# Patient Record
Sex: Female | Born: 1970 | Race: White | Hispanic: No
Health system: Western US, Academic
[De-identification: ages and names within clinical notes are randomized; demographics above are authoritative.]

## PROBLEM LIST (undated history)

## (undated) DIAGNOSIS — T782XXA Anaphylactic shock, unspecified, initial encounter: Secondary | ICD-10-CM

## (undated) DIAGNOSIS — B351 Tinea unguium: Secondary | ICD-10-CM

## (undated) DIAGNOSIS — K589 Irritable bowel syndrome without diarrhea: Secondary | ICD-10-CM

## (undated) DIAGNOSIS — L709 Acne, unspecified: Secondary | ICD-10-CM

## (undated) DIAGNOSIS — IMO0002 Reserved for concepts with insufficient information to code with codable children: Secondary | ICD-10-CM

## (undated) DIAGNOSIS — I7300 Raynaud's syndrome without gangrene: Secondary | ICD-10-CM

## (undated) DIAGNOSIS — J45909 Unspecified asthma, uncomplicated: Secondary | ICD-10-CM

## (undated) DIAGNOSIS — I341 Nonrheumatic mitral (valve) prolapse: Secondary | ICD-10-CM

## (undated) DIAGNOSIS — L409 Psoriasis, unspecified: Secondary | ICD-10-CM

## (undated) DIAGNOSIS — J309 Allergic rhinitis, unspecified: Secondary | ICD-10-CM

## (undated) HISTORY — DX: Nonrheumatic mitral (valve) prolapse: I34.1

## (undated) HISTORY — DX: Allergic rhinitis, unspecified: J30.9

## (undated) HISTORY — DX: Unspecified asthma, uncomplicated: J45.909

## (undated) HISTORY — DX: Tinea unguium: B35.1

## (undated) HISTORY — DX: Psoriasis, unspecified: L40.9

## (undated) HISTORY — DX: Acne, unspecified: L70.9

## (undated) HISTORY — DX: Anaphylactic shock, unspecified, initial encounter: T78.2XXA

## (undated) HISTORY — DX: Irritable bowel syndrome without diarrhea: K58.9

## (undated) HISTORY — DX: Reserved for concepts with insufficient information to code with codable children: IMO0002

## (undated) HISTORY — PX: SURGICAL HX OTHER: 99

## (undated) HISTORY — DX: Raynaud's syndrome without gangrene: I73.00

---

## 1998-01-13 DIAGNOSIS — K2970 Gastritis, unspecified, without bleeding: Secondary | ICD-10-CM

## 1998-01-13 HISTORY — DX: Gastritis, unspecified, without bleeding: K29.70

## 2010-01-13 HISTORY — PX: PR EXCISION VAGINAL CYST/TUMOR: 57135

## 2012-02-11 ENCOUNTER — Ambulatory Visit (HOSPITAL_BASED_OUTPATIENT_CLINIC_OR_DEPARTMENT_OTHER): Payer: PRIVATE HEALTH INSURANCE | Admitting: Registered Nurse

## 2012-03-16 ENCOUNTER — Ambulatory Visit: Payer: PRIVATE HEALTH INSURANCE | Attending: Registered Nurse | Admitting: Registered Nurse

## 2012-03-16 ENCOUNTER — Encounter (HOSPITAL_BASED_OUTPATIENT_CLINIC_OR_DEPARTMENT_OTHER): Payer: Self-pay | Admitting: Registered Nurse

## 2012-03-16 VITALS — BP 125/78 | HR 67 | Ht 68.5 in | Wt 140.4 lb

## 2012-03-16 DIAGNOSIS — T782XXA Anaphylactic shock, unspecified, initial encounter: Secondary | ICD-10-CM | POA: Insufficient documentation

## 2012-03-16 DIAGNOSIS — L408 Other psoriasis: Secondary | ICD-10-CM | POA: Insufficient documentation

## 2012-03-16 DIAGNOSIS — Z0000 Encounter for general adult medical examination without abnormal findings: Secondary | ICD-10-CM | POA: Insufficient documentation

## 2012-03-16 DIAGNOSIS — L708 Other acne: Secondary | ICD-10-CM | POA: Insufficient documentation

## 2012-03-16 MED ORDER — EPINEPHRINE 0.3 MG/0.3ML IJ DEVI
INTRAMUSCULAR | Status: DC
Start: 2012-03-16 — End: 2013-05-02

## 2012-03-16 MED ORDER — TRETINOIN 0.025 % EX CREA
TOPICAL_CREAM | CUTANEOUS | Status: DC
Start: 2012-03-16 — End: 2013-05-06

## 2012-03-16 MED ORDER — FLUOCINOLONE ACETONIDE 0.01 % EX SOLN
CUTANEOUS | Status: DC
Start: 2012-03-16 — End: 2013-07-14

## 2012-03-16 NOTE — Addendum Note (Signed)
Addended by: HANSON, TYLER G on: 03/16/2012 01:52 PM     Modules accepted: Orders

## 2012-03-16 NOTE — Progress Notes (Signed)
[facility]-Clinic Note        Claudia Jensen is a 42 year old woman, here today for a preventive health visit/routine medical exam. Other problems, concerns, or updates today:    1. She is new to me and the clinic. No major concerns and is establishing care. Needs refills on meds and an annual.     Patient Active Problem List   Diagnosis   . Anaphylactic reaction       Past Medical History   Diagnosis Date   . Onychomycosis of toenail    . AR (allergic rhinitis)      uses zyrtec   . Asthma      rare and related to ar   . Gastritis 2000     endoscopy   . IBS (irritable bowel syndrome)      diet related   . Psoriasis      scalp-steroid   . Acne      trentinoin   . Raynaud's disease      lifelong   . MVP (mitral valve prolapse)      had chest pain and none now-echo normal   . Anaphylactic reaction      bees and wasps   . Abnormal pap      in the past and neg in 2012. No treatment       Family History   Problem Relation Age of Onset   . Colon Cancer Mother 37   . Parkinson's disease Father    . Hypertension Mother    . Cancer Father      esophageal cancer       Current Outpatient Prescriptions   Medication Sig   . EPINEPHrine 0.3 MG/0.3ML Injection Device Inject as instructed per patient package insert, 0.3 mg intramuscularly or subcutaneously into the thigh, if needed to treat anaphylaxis   . Fluocinolone Acetonide 0.01 % External Solution apply to the base of the scalp twice daily   . Tretinoin (RETIN-A) 0.025 % External Cream apply at hs     No current facility-administered medications for this visit.       Review of patient's allergies indicates:  No Known Allergies    History     Social History   . Marital Status: N/A     Spouse Name: N/A     Number of Children: N/A   . Years of Education: N/A     Social History Main Topics   . Smoking status: Not on file   . Smokeless tobacco: Not on file   . Alcohol Use: Not on file   . Drug Use: Not on file   . Sexually Active: Yes -- Female partner(s)          Other Topics Concern   . Not on file     Social History Narrative    Claudia Jensen and her family moved here from [place] several years ago. 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 Her girls go to [place] and [employer].     Gynecologic History  LMP:  two weeks ago  Abnormal bleeding, spotting,or discharge: No    Last pap: 2013  Contraception: vasectomy  Sexual activity: heterosexual,spouse  Sexual concerns: No    Lifestyle  Dietary Habits: discussed during visit  Calcium: dietary sources only  Current exercise habits: yes, engages in regular exercise  Substance Use: see social history  Stress: Yes: but managing     Review Of Systems:  As noted above; all  other systems reviewed and are negative. See scanned update form    Physical Exam:  BP 125/78  Pulse 67  Ht 5' 8.5" (1.74 m)  Wt 140 lb 6.4 oz (63.685 kg)  BMI 21.03 kg/m2  Body mass index is 21.03 kg/(m^2).  General appearance: healthy, alert, no distress, relaxed, cooperative  Skin: Skin color, texture, turgor normal. No rashes or concerning lesions  Neck: supple. No adenopathy. Thyroid symmetric, normal size, without nodules  Breasts: No obvious deformity or mass to inspection, no skin lesion or nipple discharge, no masses palpated, no axillary lymphadenopathy  Lungs: clear to auscultation  Heart: normal rate, regular rhythm  Abdomen: soft, non-tender. No masses or organomegaly  Pelvic: normal external genitalia and vagina, cervix normal in appearance, no CMT, no bladder tenderness, uterus normal size, shape, and consistency, no adnexal masses or tenderness   Skin: warm and dry with no rashes or concerning nevi   Neuro: speech normal, mental status intact, thoughts coherent, no focal deficits to observation, gait normal    History sections of chart reviewed and updated today YES    Assessment/Plan:    1. Reviewed and updated:  Health Maintenance   Topic Date Due   . Colonoscopy Q 5 Years  1972/04/17   . Tetanus Booster  01/03/1985   . Influenza > 6 Months   10/13/2012   . Pap Smear 21-64 Years  03/17/2015       2.  Claudia Jensen was seen today for establish care.    Diagnoses and associated orders for this visit:    Routine general medical examination at a health care facility  Overall doing well, and up to date with HCM issues as of today. Excellent self-care and motivation to continue with her healthy lifestyle practices.    - PAP SMEAR (OPTIONAL HPV)    Anaphylactic reaction  - EPINEPHrine 0.3 MG/0.3ML Injection Device; Inject as instructed per patient package insert, 0.3 mg intramuscularly or subcutaneously into the thigh, if needed to treat anaphylaxis    Preventive counseling:health maintenance  skin cancer prevention/self-exam  immunizations  dental care  other drug use  diet rich in 3 omega fatty acids and low in saturated fats  low-fat high-fiber diet  adequate calcium intake  vitamin D supplementation  proper exercise  seat belt use     Patient Education: see patient instructions/AVS    Follow-up: Follow up for annual health screening.    Claudia Jensen, MN  [facility]   [address] [place], [place] [address]

## 2012-03-16 NOTE — Patient Instructions (Signed)
Leading a Healthy Life-Six tips to help improve your health and wellness       Eat well to give your body the energy it needs.     Stay or get active.     A healthy mind is part of a healthy body.     Practice safe living habits.     Keep your mind and body free of harmful drugs and alcohol.     Get regular health care.     Tip #1: Eat well to give your body the energy it needs     Your body needs nutritious foods to stay strong and healthy. Here are some general eating guidelines:     Have 2 servings of fish or other seafood 2 times a week (1 serving = 4 ounces).     If you eat dairy products, choose low-fat (1%) or nonfat ones; consider nut, soy, rice or other non-animal milks     If you eat meat, cut down on the amount. Replace it with plant-based foods such as beans, whole grains, fruits and vegetables, and nuts and seeds.     Have less than 1,500 mg of sodium (salt) a day.     Cut down on "junk food" like alcohol, fatty foods, chips, candy, and other sweets.     Tip #2: Stay or get active     Exercise for at least 30 minutes at a time, at least 3 times a week. Regular physical activity can help you:     Live longer and feel better     Be stronger and more flexible     Build strong bones     Prevent depression and anxiety     Strengthen your immune system     Maintain a healthy body weight     Improve brain function    Tip #3: Remember: A healthy mind is part of a healthy body    A good state of mind can help you make healthy choices. Here are a few tips for keeping your mind healthy:     Reduce stress in your life!  Stress is one of the main causes of illness.    Make some time every day for things that are fun and/or pleasant     Get enough sleep. Lack of sleep reduces how well you can concentrate, increases mood swings, and raises your risk of having a car accident.     Ask your health care provider for help if you feel depressed or anxious for more than several days in a row.     Tip #4: Practice safe living  habits     Accidents and Injuries     Accidents and injuries are the 5th leading cause of death in the [place]     Women under age 35 are more likely to die in motor vehicle accidents than from any other cause.     Accidents in the home cause thousands of permanent injuries every year. The most common accidents are fires, falls, and drowning. To help yourself and your family stay safe:     Install smoke detectors on each floor of your home.     Make sure everyone in your family knows how to swim    Stay safe on the road:  o Wear a seatbelt.   o Do not ride with someone who has been drinking or taking drugs.   o Do not speak on a cell phone or send, read, or write text   messages while you are driving.   o Wear a helmet when you ride a bicycle or motorcycle.   o Get enough sleep at night, and do not drive when you are tired.     Hand Hygiene   Protect yourself from germs by washing your hands often. Always wash your hands:     After you change a diaper or use the toilet     Before you start and after you finish preparing food     Tip #5: Keep your mind and body free of harmful drugs and alcohol      Tobacco causes more health problems than any other substance. These problems include lung disease, heart disease, and many types of cancer. The nicotine in tobacco is the most addictive and widely used drug.    Too much alcohol can cause damage to your liver, heart, brain, bones, and other body tissues. Being under the influence of alcohol also increases your chance of being injured in an accident. Alcohol can cause fetal alcohol syndrome in your children if you drink regularly when you are pregnant.    Street drugs, like marijuana, cocaine, methamphetamine, heroin, or pain pills not prescribed by your doctor can harm your health. They may be mixed with harmful substances, and using them can cause people to put themselves in dangerous situations.     Tip #6: Get regular health care     Many people think they need to see  their provider only when they are sick. But, health care providers can also help you stay healthy.     Find a health care provider who works with you to manage your health.     Ask your health care provider what diseases you are at risk for. Learn what you can do to prevent or control them.    Get yourself and your family immunized against life-threatening diseases.   Don't save your concerns for your annual check-up. Come in as they occur.   Smile more-it makes you feel good!  

## 2012-03-17 LAB — CERVICAL CANCER SCREENING: Cytologic Impression: NEGATIVE

## 2012-03-19 LAB — HPV ONLY

## 2012-05-27 ENCOUNTER — Ambulatory Visit: Payer: PRIVATE HEALTH INSURANCE | Attending: Registered Nurse | Admitting: Registered Nurse

## 2012-05-27 VITALS — BP 126/80 | HR 63 | Temp 98.2°F | Wt 139.8 lb

## 2012-05-27 DIAGNOSIS — I889 Nonspecific lymphadenitis, unspecified: Secondary | ICD-10-CM | POA: Insufficient documentation

## 2012-05-27 MED ORDER — AMOXICILLIN-POT CLAVULANATE 875-125 MG OR TABS
ORAL_TABLET | ORAL | Status: DC
Start: 2012-05-27 — End: 2012-12-06

## 2012-05-27 NOTE — Progress Notes (Signed)
[facility] Clinic Note      Chief Complaint   Patient presents with   . Head Injury     Pt here for pain on the left side of her head. Pt noticed something that felt like a boyle in three different areas of her head. Pt states it seems to be geeting worse. Unable to sleep on left side.       Issues Discussed/HPI:    1. As above she noted a burning and achy pain on the left around 5 days ago and it has not gotten better. She thought there may be a lesion or two, but has not been able to find any on her scalp. Hard to sleep on that side of her head due to the pain with pressure. No internal headache, fever, chills, sick sx. No neuro changes or sx. No new product use or trauma. Neck is not tender and HEENT is otherwise neg    Patient Active Problem List   Diagnosis   . Anaphylactic reaction       Current Outpatient Prescriptions   Medication Sig   . Amoxicillin-Pot Clavulanate 875-125 MG Oral Tab Take 1 tablet by mouth every 12 hours for infection, until all medication is taken   . EPINEPHrine 0.3 MG/0.3ML Injection Device Inject as instructed per patient package insert, 0.3 mg intramuscularly or subcutaneously into the thigh, if needed to treat anaphylaxis   . Fluocinolone Acetonide 0.01 % External Solution apply to the base of the scalp twice daily   . Tretinoin (RETIN-A) 0.025 % External Cream apply at hs     No current facility-administered medications for this visit.       Review of patient's allergies indicates:  No Known Allergies    Physical Exam:  BP 126/80  Pulse 63  Temp(Src) 98.2 F (36.8 C) (Temporal)  Wt 139 lb 12.8 oz (63.413 kg)  BMI 20.94 kg/m2  LMP 05/06/2012  Constitutional: Appearance: Well developed, appearing stated age and in no acute distress  Eyes: Lids and Conjunctiva: Conjunctiva and lids are normal.  Pupils: Puplis equal, round and reactive to light.    ENT: Otoscopic exam: canals are clear; TMs show normal landmarks without injection or fluid behind the membranes,      Neck: Inspection:  normal alignment; no masses  Thyroid:  No enlargement, masses or tenderness.  Cervial nodes: no adenopathy in the neck but she does have an enlarged posterior occip lymph node and this is tender. No lesions noted throughout scalp    Assessment and Plan:  Brynnleigh was seen today for head injury.    Diagnoses and associated orders for this visit:    Lymphadenitis  -       I advised her that I am not really sure what this is but given the enlarged node and tenderness without any other physical findings, I have diagnosed her with the above. She will let me know if her sx worsen and if they get better with the treatment. No evidence of shingles or herpes  - Amoxicillin-Pot Clavulanate 875-125 MG Oral Tab; Take 1 tablet by mouth every 12 hours for infection, until all medication is taken    Patient Ed:   Patient Instructions   Message me in a week with your status, or sooner if your sx do not get better.     Follow-up:  As above    Baker Pierini MN, ARNP  [facility]  [facility]  [address] [place] [address]

## 2012-05-27 NOTE — Patient Instructions (Signed)
Message me in a week with your status, or sooner if your sx do not get better.

## 2012-06-07 ENCOUNTER — Encounter (HOSPITAL_BASED_OUTPATIENT_CLINIC_OR_DEPARTMENT_OTHER): Payer: Self-pay | Admitting: Registered Nurse

## 2012-06-08 NOTE — Telephone Encounter (Signed)
Claudia Jensen will see Claudia Jensen on 06/15/12.

## 2012-06-08 NOTE — Telephone Encounter (Signed)
Message left on voice mail to return my call.

## 2012-06-10 ENCOUNTER — Telehealth (HOSPITAL_BASED_OUTPATIENT_CLINIC_OR_DEPARTMENT_OTHER): Payer: Self-pay | Admitting: Registered Nurse

## 2012-06-10 NOTE — Telephone Encounter (Signed)
CONFIRMED PHONE NUMBER: [phone]  CALLERS FIRST AND LAST NAME: Annalynne Ibanez  FACILITY NAME: NA TITLE: NA  CALLERS RELATIONSHIP:Self  RETURN CALL: Detailed message on voicemail only     SUBJECT: General Message   REASON FOR REQUEST: sooner appointment request    MESSAGE: Patient called asking to be seen sooner than her 06/15/12 appointment. She got an e-care message (please see previous closed message) to call if she wanted to come in sooner. Nothing was available with Lisette Grinder, and patient declined a different provider. Please contact her if it is possible for her to be seen sooner. Thanks!

## 2012-06-15 ENCOUNTER — Ambulatory Visit: Payer: PRIVATE HEALTH INSURANCE | Attending: Registered Nurse | Admitting: Registered Nurse

## 2012-06-15 ENCOUNTER — Encounter (HOSPITAL_BASED_OUTPATIENT_CLINIC_OR_DEPARTMENT_OTHER): Payer: Self-pay | Admitting: Registered Nurse

## 2012-06-15 VITALS — BP 137/79 | HR 62 | Temp 98.5°F | Wt 140.4 lb

## 2012-06-15 DIAGNOSIS — R5383 Other fatigue: Secondary | ICD-10-CM | POA: Insufficient documentation

## 2012-06-15 DIAGNOSIS — R5381 Other malaise: Secondary | ICD-10-CM | POA: Insufficient documentation

## 2012-06-15 DIAGNOSIS — J0110 Acute frontal sinusitis, unspecified: Secondary | ICD-10-CM | POA: Insufficient documentation

## 2012-06-15 LAB — CBC (HEMOGRAM)
Hematocrit: 35 % — ABNORMAL LOW (ref 36–45)
Hemoglobin: 11.4 g/dL — ABNORMAL LOW (ref 11.5–15.5)
MCH: 29.8 pg (ref 27.3–33.6)
MCHC: 32.6 g/dL (ref 32.2–36.5)
MCV: 91 fL (ref 81–98)
Platelet Count: 188 10*3/uL (ref 150–400)
RBC: 3.83 mil/uL (ref 3.80–5.00)
RDW-CV: 12.5 % (ref 11.6–14.4)
WBC: 4.83 10*3/uL (ref 4.3–10.0)

## 2012-06-15 LAB — THYROID STIMULATING HORMONE: Thyroid Stimulating Hormone: 1.455 u[IU]/mL (ref 0.400–5.000)

## 2012-06-15 MED ORDER — AMOXICILLIN-POT CLAVULANATE 875-125 MG OR TABS
ORAL_TABLET | ORAL | Status: DC
Start: 2012-06-15 — End: 2012-12-06

## 2012-06-15 NOTE — Patient Instructions (Signed)
Plan to email in three weeks for a follow up and I will get back to you when I see labs

## 2012-06-16 NOTE — Progress Notes (Signed)
[facility] Clinic Note      Chief Complaint   Patient presents with   . Follow-Up      Follow up check after finishing antibiotics        Issues Discussed/HPI:    1. She noticed a 75 percent improvement in her sx when she took the antibiotics. She has no further pain or sensitivity on the outside of her head but does report sinus congestion and throbbing sensations on the left side of her face and head. She also has notable fatigue and does not know why. She has chronic allergies and does not treat them. Often will note her sinuses being full and has had sinus infections in the past. No other neuro sx. No ear pain or aural fullness. Scalp without irritation or lesions.     ROS: CONSTITUTIONAL: Denies, unexpected weight loss, unexpected weight gain  ENT: Denies, hearing difficulties, ear pain, tinnitus and epistaxis  NEUROLOGIC: Denies, dizziness, syncope, focal weakness, sensory deficits and paresthesias    Patient Active Problem List   Diagnosis   . Anaphylactic reaction       Current Outpatient Prescriptions   Medication Sig   . Amoxicillin-Pot Clavulanate 875-125 MG Oral Tab Take 1 tablet by mouth every 12 hours for infection, until all medication is taken   . Amoxicillin-Pot Clavulanate 875-125 MG Oral Tab Take 1 tablet by mouth every 12 hours for infection, until all medication is taken   . EPINEPHrine 0.3 MG/0.3ML Injection Device Inject as instructed per patient package insert, 0.3 mg intramuscularly or subcutaneously into the thigh, if needed to treat anaphylaxis   . Fluocinolone Acetonide 0.01 % External Solution apply to the base of the scalp twice daily   . Tretinoin (RETIN-A) 0.025 % External Cream apply at hs     No current facility-administered medications for this visit.       Review of patient's allergies indicates:  No Known Allergies    Physical Exam:  BP 137/79  Pulse 62  Temp(Src) 98.5 F (36.9 C)  Wt 140 lb 6.4 oz (63.685 kg)  BMI 21.03 kg/m2  LMP  06/05/2012  Constitutional: Appearance: Well developed, appearing stated age and in no acute distress  ENT: Otoscopic exam: canals are clear; TMs show normal landmarks without injection or fluid behind the membranes,   Nose: normal nasal mucosa, septum and turbinates. Tender in the max sinuses with palaption  Pharynx: oropharynx  without post-nasal drainage or erythema  Neck: Cervical nodes: no adenopathy  Axillary nodes:  No adenopathy.  Neuro: Cranial Nerves:  CNs II - XII normal.  DTRs:  Normal DTRs.  No pathologic reflexes.  Sensation:Normal to light touch and pin in hands and feet    Lab review: orders written for new lab studies as appropriate; see orders.     Assessment and Plan:  Shimika was seen today for follow-up .    Diagnoses and associated orders for this visit:    Sinusitis, acute frontal  -      She has fatigue and left sided sx consistent with sinus congestion and got better with a course of antibiotics for her lymphadenitis two weeks ago. Did not have sinus sx at that time, but may have had a low grade infection then as well. Will extend the duration of meds and have her let me know if she is not getting better or if she is getting worse.   - Amoxicillin-Pot Clavulanate 875-125 MG Oral Tab; Take 1 tablet by mouth every  12 hours for infection, until all medication is taken  - VITAMIN D (25 HYDROXY)    Fatigue  - CBC (HEMOGRAM)  - THYROID STIMULATING HORMONE  - VITAMIN D (25 HYDROXY)    Patient Ed:   Patient Instructions   Plan to email in three weeks for a follow up and I will get back to you when I see labs    Follow-up:  return to clinic for recheck    Baker Pierini MN, ARNP  [facility]  [address] [place], [place] [address]

## 2012-06-17 LAB — VITAMIN D (25 HYDROXY)
Vit D (25_Hydroxy) Total: 14.8 ng/mL — ABNORMAL LOW (ref 20.1–50.0)
Vitamin D2 (25_Hydroxy): <1 ng/mL
Vitamin D3 (25_Hydroxy): 14.8 ng/mL

## 2012-06-24 IMAGING — CT CT ABD-PELV W/ CM
1 of 2 series · 15 of 32 positions shown, 19 images · IV contrast (APPLIED)
Comparison: None.

CLINICAL DATA: Right flank pain and mid abdominal pain, acute onset
approximately 8 hours ago.

CT ABDOMEN AND PELVIS WITH CONTRAST 11/25/2009:
TECHNIQUE: Multidetector CT imaging of the abdomen and pelvis was
performed following the standard protocol during bolus
administration of intravenous contrast.
Contrast: 100 ml [id number] IV.  Oral contrast also
administered.

[Series 2: abd/pelv with 5.0 b31f st · axial · 0.68mm/px · z∈[-410,-16]mm · 15 of 87 slices shown, 19 images]
[im 4/87  soft-tissue]
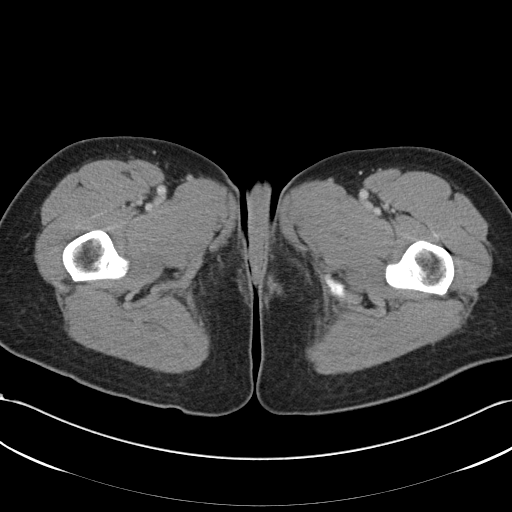
[im 4/87  bone]
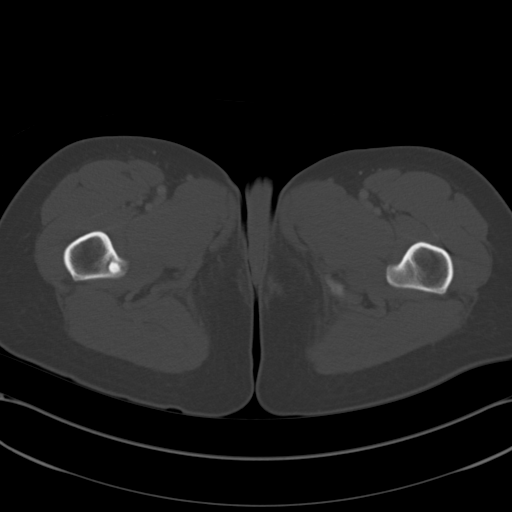
[im 10/87  soft-tissue]
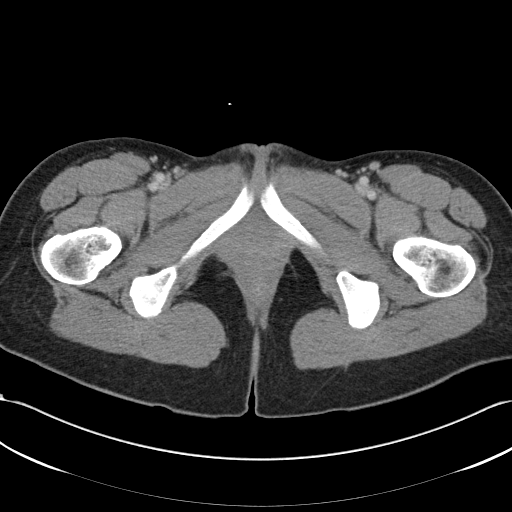
[im 17/87  soft-tissue]
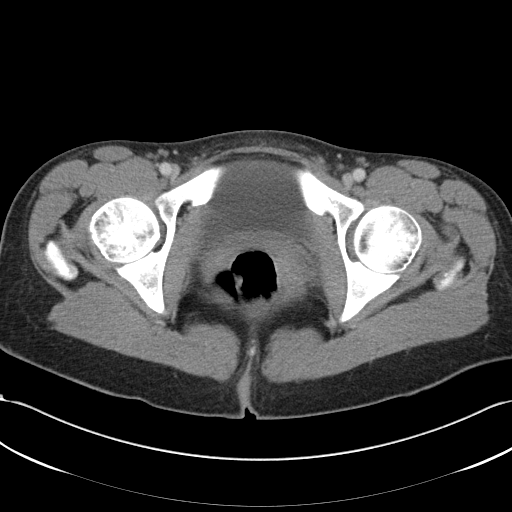
[im 24/87  soft-tissue]
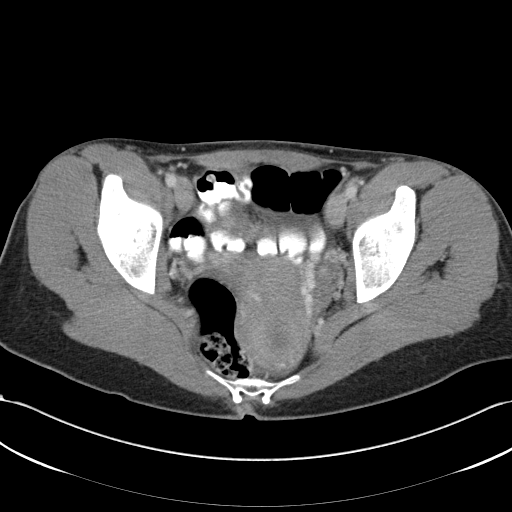
[im 30/87  soft-tissue]
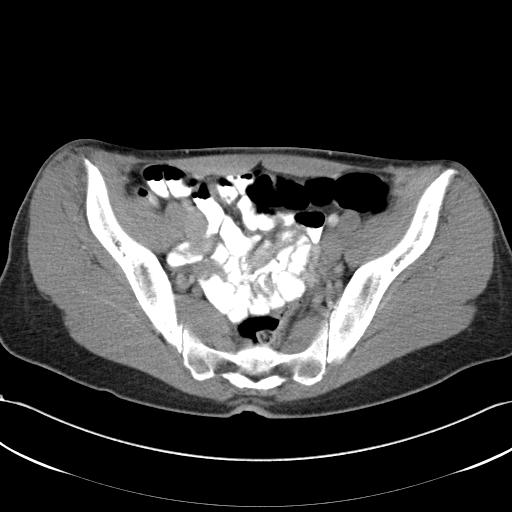
[im 37/87  soft-tissue]
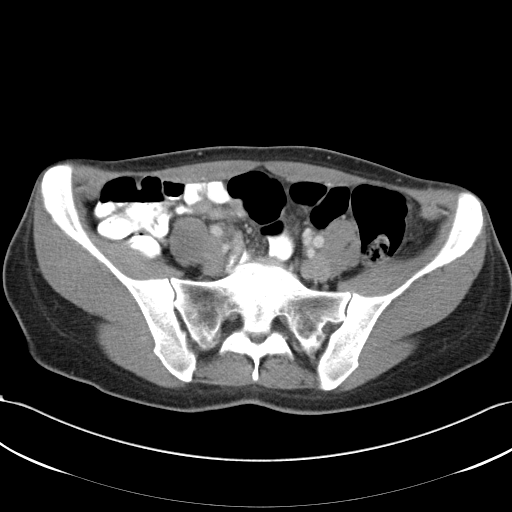
[im 44/87  soft-tissue]
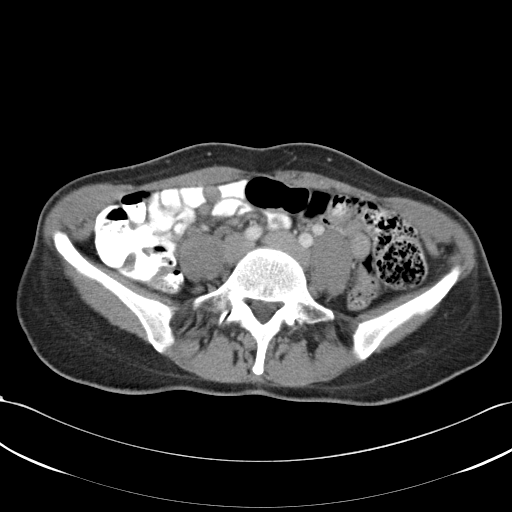
[im 50/87  soft-tissue]
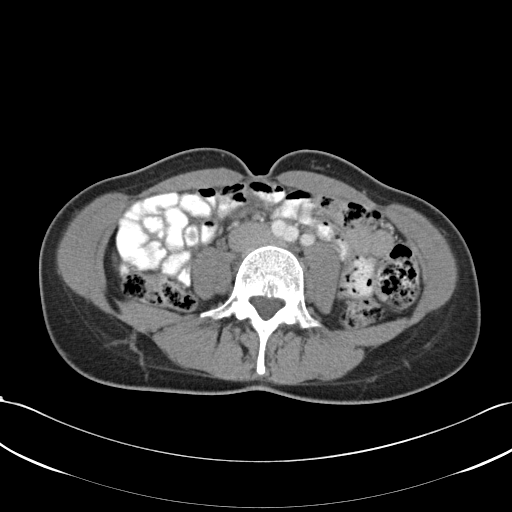
[im 57/87  soft-tissue]
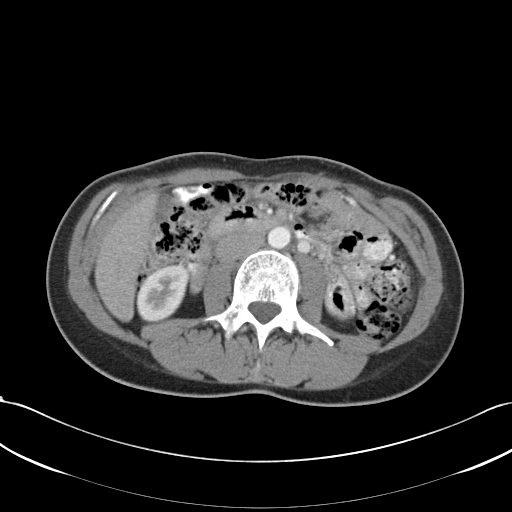
[im 57/87  bone]
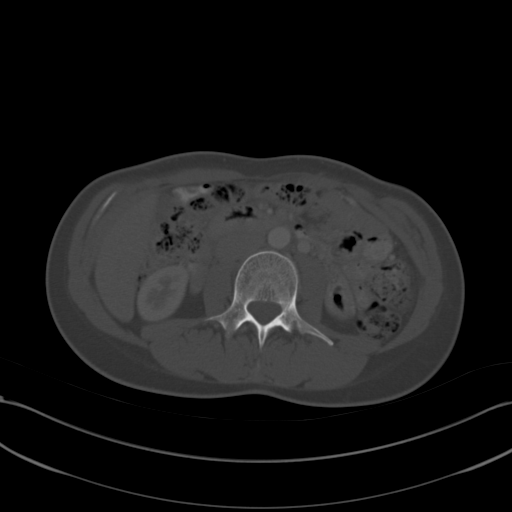
[im 63/87  soft-tissue]
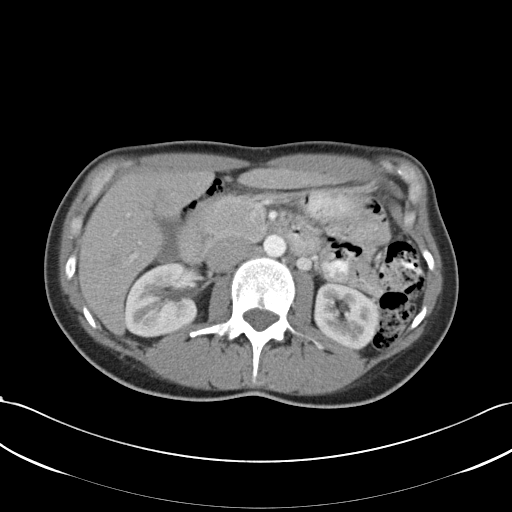
[im 70/87  soft-tissue]
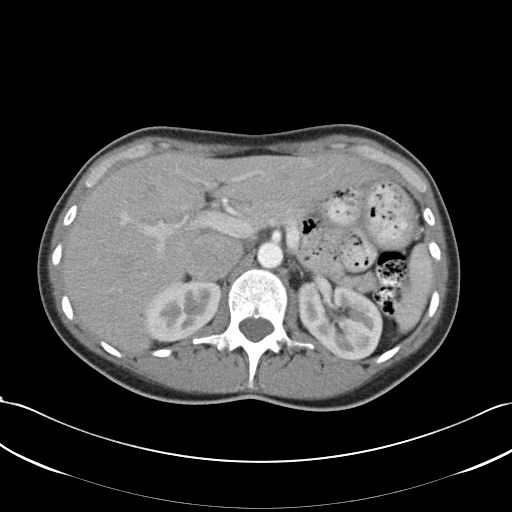
[im 73/87  lung]
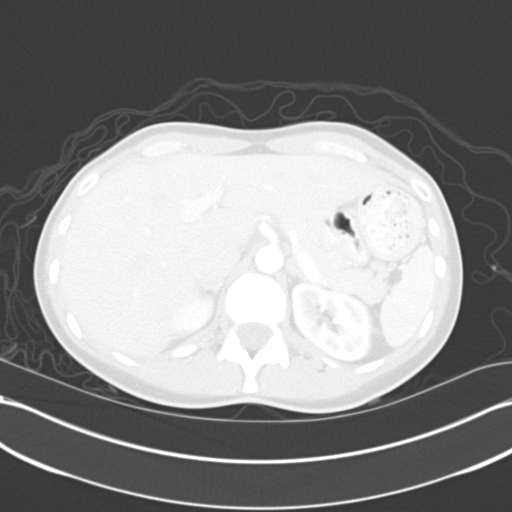
[im 77/87  soft-tissue]
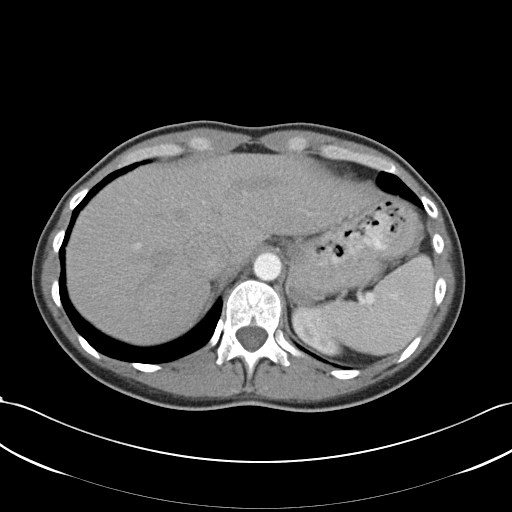
[im 77/87  lung]
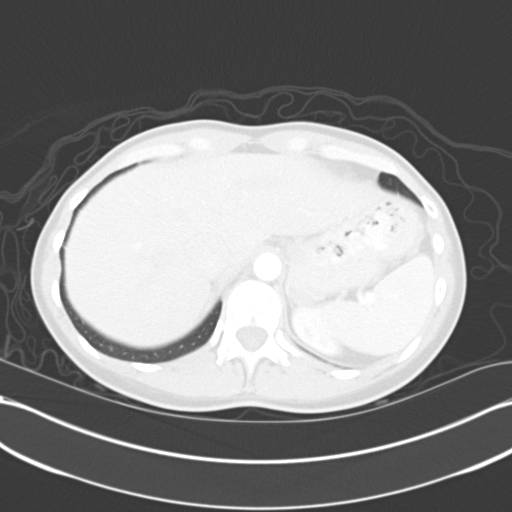
[im 80/87  lung]
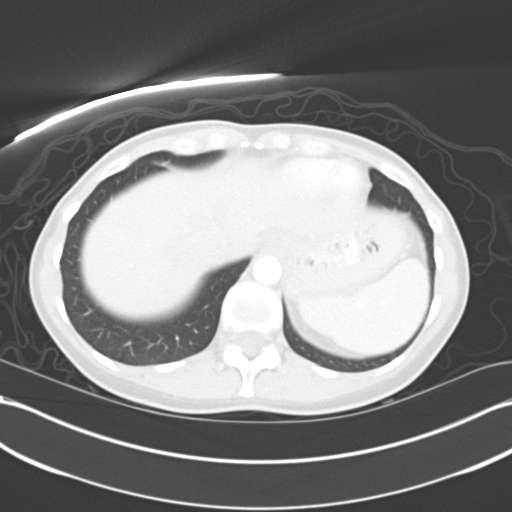
[im 83/87  soft-tissue]
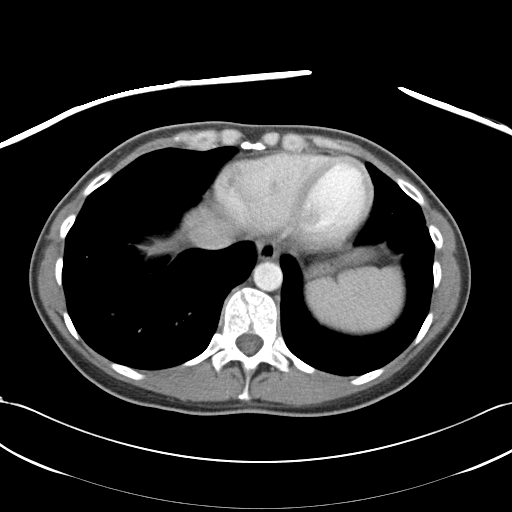
[im 83/87  lung]
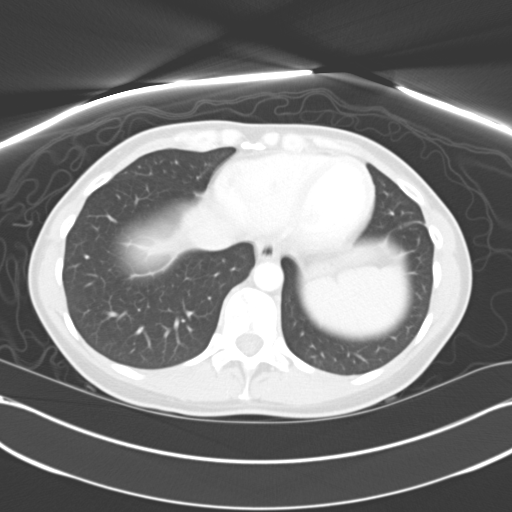

[15 of 32 positions shown; findings below may reference images not displayed]

FINDINGS: Approximate 1.5 cm lesion in the medial segment left lobe
of liver with Hounsfield measurements approximately 40; liver
otherwise normal in appearance.  Normal appearing spleen, pancreas,
adrenal glands, and kidneys.  Gallbladder unremarkable by CT.  No
biliary ductal dilation.  Stomach filled with food and normal in
appearance.  Normal-appearing small bowel.  Large amount of stool
throughout an otherwise normal-appearing colon.  Normal appendix in
the right upper pelvis.  No ascites.  No significant
lymphadenopathy.  No visible aorto-iliofemoral atherosclerosis.

Left ovarian vein varicocele.  Normal-appearing retroverted uterus.
No adnexal masses.  No free pelvic fluid.  Urinary bladder
unremarkable.  Bone window images unremarkable.  Visualized lung
bases clear.
IMPRESSION: 1.  No acute abnormalities in the abdomen or pelvis apart from
possible constipation.
2.  Approximate 1.5 cm lesion in the left lobe of the liver,
statistically a hemangioma, but technically indeterminate by CT.
3.  Left ovarian vein varicocele.
4.  Retroverted uterus which is normal in appearance.

## 2012-11-18 ENCOUNTER — Other Ambulatory Visit: Payer: Self-pay

## 2012-12-06 ENCOUNTER — Ambulatory Visit: Payer: PRIVATE HEALTH INSURANCE | Attending: Registered Nurse | Admitting: Registered Nurse

## 2012-12-06 VITALS — BP 136/84 | HR 55 | Wt 142.6 lb

## 2012-12-06 DIAGNOSIS — R030 Elevated blood-pressure reading, without diagnosis of hypertension: Secondary | ICD-10-CM | POA: Insufficient documentation

## 2012-12-06 DIAGNOSIS — L408 Other psoriasis: Secondary | ICD-10-CM | POA: Insufficient documentation

## 2012-12-06 DIAGNOSIS — R42 Dizziness and giddiness: Secondary | ICD-10-CM | POA: Insufficient documentation

## 2012-12-06 LAB — BASIC METABOLIC PANEL
Anion Gap: 5 (ref 3–11)
Calcium: 9.1 mg/dL (ref 8.9–10.2)
Carbon Dioxide, Total: 26 mEq/L (ref 22–32)
Chloride: 107 mEq/L (ref 98–108)
Creatinine: 0.65 mg/dL (ref 0.38–1.02)
GFR, Calc, African American: >60 mL/min (ref >59)
GFR, Calc, European American: >60 mL/min (ref >59)
Glucose: 86 mg/dL (ref 62–125)
Potassium: 4 mEq/L (ref 3.7–5.2)
Sodium: 138 mEq/L (ref 136–145)
Urea Nitrogen: 9 mg/dL (ref 8–21)

## 2012-12-06 LAB — CBC (HEMOGRAM)
Hematocrit: 39 % (ref 36–45)
Hemoglobin: 13 g/dL (ref 11.5–15.5)
MCH: 31 pg (ref 27.3–33.6)
MCHC: 33.6 g/dL (ref 32.2–36.5)
MCV: 92 fL (ref 81–98)
Platelet Count: 190 10*3/uL (ref 150–400)
RBC: 4.19 mil/uL (ref 3.80–5.00)
RDW-CV: 11.7 % (ref 11.6–14.4)
WBC: 5.49 10*3/uL (ref 4.3–10.0)

## 2012-12-06 LAB — THYROID STIMULATING HORMONE: Thyroid Stimulating Hormone: 1.267 u[IU]/mL (ref 0.400–5.000)

## 2012-12-06 NOTE — Progress Notes (Signed)
[facility] Clinic Note      Chief Complaint   Patient presents with   . 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 She has a family history of hypertension and also became hypertensive on the pill, so discontinued. She recently bought a cuff to check her home bps. She has noted a bit of dizziness when walking, but not running. She has allergies which she does not treat as sthey are not bad enough to use meds on a daily basis. No tinnitus, ear pain, vertigo, nausea, palpitations, exercise intolerance, LE swelling, chest pain. Exercises most days of the week.     2.Requests to see a Derm as she psoriasis is not getting better with the fluocinolide solution.     Patient Active Problem List   Diagnosis   . Anaphylactic reaction   . Elevated BP       Current Outpatient Prescriptions   Medication Sig   . EPINEPHrine 0.3 MG/0.3ML Injection Device Inject as instructed per patient package insert, 0.3 mg intramuscularly or subcutaneously into the thigh, if needed to treat anaphylaxis   . Fluocinolone Acetonide 0.01 % External Solution apply to the base of the scalp twice daily   . Tretinoin (RETIN-A) 0.025 % External Cream apply at hs     No current facility-administered medications for this visit.       Review of patient's allergies indicates:  No Known Allergies    Physical Exam:  BP 136/84  Pulse 55  Wt 142 lb 9.6 oz (64.683 kg)  BMI 21.36 kg/m2  Constitutional: Appearance: Well developed, appearing stated age and in no acute distress  ENT: Otoscopic exam: canals are clear; TMs show dull cone of light and landmarks without injection or fluid behind the membranes,   Nose: normal nasal mucosa, septum and turbinates  Pharynx: oropharynx shows normal tonsils and adenoids without post-nasal drainage  Cardiac:  Auscultation:  Regular rate and rhythym.  S1 and S2 are normal.  No murmurs, gallops or  rubs.    Lab review: orders written for new lab studies as appropriate; see orders.     Assessment and Plan:  Claudia Jensen was seen today for blood pressure.    Diagnoses and associated orders for this visit:    Dizziness  - CBC (HEMOGRAM)  - THYROID STIMULATING HORMONE  - BASIC METABOLIC PANEL    Psoriasis  - REFERRAL TO DERMATOLOGY    Elevated BP  -         Will monitor and send me results via ecare.       Patient Ed:   Medicated shampoo including ketoconazole and selsun nightly for two week, then twice a week for maintenance.     Send me the blood pressures you have collected in one month    Start on an antihistamine and use daily for the next two weeks and let me know if your symptoms do not get better.     Follow-up:  As above    Baker Pierini MN, ARNP  [facility]  [address] [place], [place] [address]

## 2012-12-06 NOTE — Patient Instructions (Addendum)
Medicated shampoo including ketoconazole and selsun nightly for two week, then twice a week for maintenance.     Send me the blood pressures you have collected in one month    Start on an antihistamine and use daily for the next two weeks and let me know if your symptoms do not get better.

## 2013-05-02 ENCOUNTER — Encounter (HOSPITAL_BASED_OUTPATIENT_CLINIC_OR_DEPARTMENT_OTHER): Payer: Self-pay | Admitting: Registered Nurse

## 2013-05-02 ENCOUNTER — Ambulatory Visit: Payer: No Typology Code available for payment source | Attending: Registered Nurse | Admitting: Registered Nurse

## 2013-05-02 VITALS — BP 125/89 | HR 63 | Temp 97.8°F | Resp 12 | Ht 68.5 in | Wt 142.2 lb

## 2013-05-02 DIAGNOSIS — M940 Chondrocostal junction syndrome [Tietze]: Secondary | ICD-10-CM | POA: Insufficient documentation

## 2013-05-02 DIAGNOSIS — T782XXS Anaphylactic shock, unspecified, sequela: Secondary | ICD-10-CM

## 2013-05-02 DIAGNOSIS — T788XXS Other adverse effects, not elsewhere classified, sequela: Secondary | ICD-10-CM | POA: Insufficient documentation

## 2013-05-02 DIAGNOSIS — T7589XS Other specified effects of external causes, sequela: Secondary | ICD-10-CM | POA: Insufficient documentation

## 2013-05-02 MED ORDER — EPINEPHRINE 0.3 MG/0.3ML IJ SOAJ
0.3000 mg | Freq: Once | INTRAMUSCULAR | Status: DC | PRN
Start: 2013-05-02 — End: 2013-05-02

## 2013-05-02 NOTE — Patient Instructions (Addendum)
Aleve one-two pilsl, twice daily for the next five days with food    Avoid lifting for the next month to help heal the tissue between the ribs

## 2013-05-04 NOTE — Progress Notes (Signed)
[facility] Note      Chief Complaint   Patient presents with   . Chest Problem     Pt c/o chest/rib tenderness for couple of months, tender to touch not movement.       Issues Discussed/HPI:    1. She has had focal pain on the left chest wall for the last couple of months. Pain is sharp, worse with palpation, not associated with exercise or chest movement, respiration, fever chills, cough. Better with rest and holding still. Lifts weights and is very active, but no known triggering event. No radiation of pain, respiratory, cardiac or GI sx.     ROS: see hpi    Patient Active Problem List   Diagnosis   . Anaphylactic reaction   . Elevated BP       Current Outpatient Prescriptions   Medication Sig Dispense Refill   . Cholecalciferol (VITAMIN D) 2000 UNITS Oral Cap Take 2,000 Units by mouth daily.       . Fluocinolone Acetonide 0.01 % External Solution apply to the base of the scalp twice daily  60 mL  3   . Tretinoin (RETIN-A) 0.025 % External Cream apply at hs  45 g  11     No current facility-administered medications for this visit.       Review of patient's allergies indicates:  No Known Allergies    Physical Exam:  BP 125/89   Pulse 63   Temp(Src) 97.8 F (36.6 C) (Temporal)   Resp 12   Ht 5' 8.5" (1.74 m)   Wt 142 lb 3.2 oz (64.501 kg)   BMI 21.30 kg/m2     LMP 04/09/2013     Constitutional: Appearance: Well developed, appearing stated age and in no acute distress  Breasts: Palpation:  Normal breast tissue bilaterally, no tenderness, no suspicious masses.    Chest: no color changes, swelling or obvious deformity. Pain with palpation of the costochondral junction on the left at the level of the 5th interspace.   Lungs:  Auscultation:  Clear lung fields without crackles, wheezing or expiratory prolongation  Cardiac:  Auscultation:  Regular rate and rhythym.  S1 and S2 are normal.  No murmurs, gallops or rubs.  Abdomen:  Abdominal exam:  normal in appearance with normal bowel sounds  and no masses or tenderness.    Assessment and Plan:  Leni was seen today for chest problem.    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 Inject into the thigh as instructed per patient package insert.    Patient Ed:   Patient Instructions   Aleve one-two pilsl, twice daily for the next five days with food    Avoid lifting for the next month to help heal the tissue between the ribs    Follow-up:  notify clinic of changes in condition    Thea Silversmith [place], [facility]  [address] [place] [address]

## 2013-05-06 ENCOUNTER — Other Ambulatory Visit: Payer: Self-pay | Admitting: Pharmacist

## 2013-05-06 ENCOUNTER — Encounter (HOSPITAL_BASED_OUTPATIENT_CLINIC_OR_DEPARTMENT_OTHER): Payer: Self-pay | Admitting: Registered Nurse

## 2013-05-06 DIAGNOSIS — L709 Acne, unspecified: Secondary | ICD-10-CM

## 2013-05-06 MED ORDER — TRETINOIN 0.025 % EX CREA
TOPICAL_CREAM | CUTANEOUS | Status: DC
Start: 2013-05-06 — End: 2014-07-19

## 2013-05-07 ENCOUNTER — Ambulatory Visit: Payer: Self-pay

## 2013-05-07 NOTE — Telephone Encounter (Signed)
Caller tells me she dropped a 50 lb carton of tile on hand. Some swelling. Has a new lump near base of fingers. Not sure if broken. 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 Advised to go in but also told if going to wait, should elevate and ice pack. Pain not any issue says pt.  Pt will go if it gets worse or doesn't improve.   Protocol: TRAUMA - HAND AND WRIST-ADULT-AH  Affirmative: Looks like a broken bone (e.g., knuckle is gone or depressed)  Disposition of Go to ED Now suggested.

## 2013-05-07 NOTE — Telephone Encounter (Signed)
-----   Message from [place] sent at 05/07/2013  2:19 PM PDT -----  Regarding: Dropped of 50 lbs of ceramic tile in her hand  >> Claudia Jensen 05/07/2013 02:19 PM  Dropped of 50 lbs of ceramic tile in her hand

## 2013-05-09 ENCOUNTER — Telehealth (HOSPITAL_BASED_OUTPATIENT_CLINIC_OR_DEPARTMENT_OTHER): Payer: Self-pay | Admitting: Registered Nurse

## 2013-05-09 NOTE — Telephone Encounter (Signed)
Prior authorization for Tretinoin was fax to [employer] [phone].  Awaiting for answer.

## 2013-05-09 NOTE — Telephone Encounter (Signed)
Duplicate encounter

## 2013-05-09 NOTE — Telephone Encounter (Signed)
Prior authorization was requested for Tretinoin 0.025% cream  This is not covered with patient's plan  PA form will be filled out, form was given to Stefano Gaul  Will fax back to [facility] [phone].

## 2013-05-09 NOTE — Telephone Encounter (Signed)
Pt contacted CC triage RN on 05/07/13  Presenting Problem: Patient injured hand    Contacted pt to follow up on above call.   Msg left to call back if she is still having problems with her hand.

## 2013-05-09 NOTE — Telephone Encounter (Signed)
Prior authorization for Tretinoin 0.025 % cream was fax to [facility] [phone].  Awaiting for reply from [facility].

## 2013-05-09 NOTE — Telephone Encounter (Signed)
Prior authorization will be initiated after it is signed by Stefano Gaul.  I will fax to [facility] [phone].

## 2013-05-12 ENCOUNTER — Encounter (HOSPITAL_BASED_OUTPATIENT_CLINIC_OR_DEPARTMENT_OTHER): Payer: PRIVATE HEALTH INSURANCE | Admitting: Dermatology

## 2013-05-12 ENCOUNTER — Telehealth (HOSPITAL_BASED_OUTPATIENT_CLINIC_OR_DEPARTMENT_OTHER): Payer: Self-pay | Admitting: Registered Nurse

## 2013-05-12 NOTE — Telephone Encounter (Signed)
[facility] determined the original decision to be overturned. TRETINOIN cream 0.025% was approved.  Patient was notified.

## 2013-05-26 ENCOUNTER — Encounter (HOSPITAL_BASED_OUTPATIENT_CLINIC_OR_DEPARTMENT_OTHER): Payer: PRIVATE HEALTH INSURANCE | Admitting: Dermatology

## 2013-07-06 ENCOUNTER — Ambulatory Visit (HOSPITAL_BASED_OUTPATIENT_CLINIC_OR_DEPARTMENT_OTHER): Payer: No Typology Code available for payment source | Admitting: Registered Nurse

## 2013-07-14 ENCOUNTER — Ambulatory Visit (HOSPITAL_BASED_OUTPATIENT_CLINIC_OR_DEPARTMENT_OTHER): Payer: No Typology Code available for payment source

## 2013-07-14 ENCOUNTER — Telehealth (HOSPITAL_BASED_OUTPATIENT_CLINIC_OR_DEPARTMENT_OTHER): Payer: Self-pay

## 2013-07-14 ENCOUNTER — Encounter (HOSPITAL_BASED_OUTPATIENT_CLINIC_OR_DEPARTMENT_OTHER): Payer: Self-pay | Admitting: Registered Nurse

## 2013-07-14 ENCOUNTER — Other Ambulatory Visit (HOSPITAL_BASED_OUTPATIENT_CLINIC_OR_DEPARTMENT_OTHER): Payer: Self-pay | Admitting: Registered Nurse

## 2013-07-14 ENCOUNTER — Ambulatory Visit: Payer: No Typology Code available for payment source | Attending: Registered Nurse | Admitting: Registered Nurse

## 2013-07-14 VITALS — BP 117/75 | HR 55 | Wt 142.9 lb

## 2013-07-14 DIAGNOSIS — Z130 Encounter for screening for diseases of the blood and blood-forming organs and certain disorders involving the immune mechanism: Secondary | ICD-10-CM | POA: Insufficient documentation

## 2013-07-14 DIAGNOSIS — Z13228 Encounter for screening for other metabolic disorders: Secondary | ICD-10-CM | POA: Insufficient documentation

## 2013-07-14 DIAGNOSIS — Z0000 Encounter for general adult medical examination without abnormal findings: Secondary | ICD-10-CM | POA: Insufficient documentation

## 2013-07-14 DIAGNOSIS — Z23 Encounter for immunization: Secondary | ICD-10-CM | POA: Insufficient documentation

## 2013-07-14 DIAGNOSIS — Z1231 Encounter for screening mammogram for malignant neoplasm of breast: Secondary | ICD-10-CM

## 2013-07-14 DIAGNOSIS — I341 Nonrheumatic mitral (valve) prolapse: Secondary | ICD-10-CM | POA: Insufficient documentation

## 2013-07-14 DIAGNOSIS — Z800 Family history of malignant neoplasm of digestive organs: Secondary | ICD-10-CM | POA: Insufficient documentation

## 2013-07-14 DIAGNOSIS — Z1329 Encounter for screening for other suspected endocrine disorder: Secondary | ICD-10-CM | POA: Insufficient documentation

## 2013-07-14 DIAGNOSIS — Z808 Family history of malignant neoplasm of other organs or systems: Secondary | ICD-10-CM | POA: Insufficient documentation

## 2013-07-14 HISTORY — DX: Family history of malignant neoplasm of other organs or systems: Z80.8

## 2013-07-14 HISTORY — DX: Family history of malignant neoplasm of digestive organs: Z80.0

## 2013-07-14 LAB — LIPID PANEL
Cholesterol (LDL): 90 mg/dL (ref <130)
Cholesterol/HDL Ratio: 2.7
HDL Cholesterol: 63 mg/dL (ref >40)
Non-HDL Cholesterol: 105 mg/dL (ref 0–159)
Total Cholesterol: 168 mg/dL (ref <200)
Triglyceride: 73 mg/dL (ref <150)

## 2013-07-14 LAB — BASIC METABOLIC PANEL
Anion Gap: 5 (ref 4–12)
Calcium: 8.8 mg/dL — ABNORMAL LOW (ref 8.9–10.2)
Carbon Dioxide, Total: 28 mEq/L (ref 22–32)
Chloride: 105 mEq/L (ref 98–108)
Creatinine: 0.7 mg/dL (ref 0.38–1.02)
GFR, Calc, African American: >60 mL/min (ref >59)
GFR, Calc, European American: >60 mL/min (ref >59)
Glucose: 89 mg/dL (ref 62–125)
Potassium: 3.9 mEq/L (ref 3.6–5.2)
Sodium: 138 mEq/L (ref 135–145)
Urea Nitrogen: 10 mg/dL (ref 8–21)

## 2013-07-14 NOTE — Telephone Encounter (Signed)
Sharepoint Assessment Questions    5'8   142lbs   BMI 21.6    Does the patient have sufficient understanding of English? Yes  If NO, which language?    Is the patient able to provide consent? Yes (If no, fill in below)    Legal Guardian                                    DPOA:  NAME:                                            NAME:    Phone:                                           Phone:      PROCEDURE        Procedure MD: Dr. Ova Freshwater   Procedure Type: Colonoscopy  Procedure Date:  08/12/2013     Time:12:45PM    Procedure Check-In Time: 12:15PM    Patient Assessment    Have you ever had any problems in the past with sedation? No  If yes What kind?    (If yes, send to [facility] for evaluation)    Does the patient use Oxygen at Home?  No  (If yes, send to [facility] for evaluation)    If this is an ERCP appointment ask, does the patient have a contrast allergy?  No  If yes, what type of reaction?    (If yes, forward the TE to "p [facility]" for an RN to review)    Is the patient on Anti-coagulant? No  If yes, what Anti-coag medication:   Prescribing provider:     Does the patient have any bleeding or anti-coagulation disorders? No  If yes What kind?      Is the patient Diabetic?  No  Was patient advised to contact their Prescribing MD for direction?      Is the patient on Dialysis? No  If yes: is it Peritoneal Dialysis?     Does the patient use narcotics on a daily basis? No    Does the patient have sleep apnea? No  If yes, does the patient use a sleep apnea machine?   If yes, was patient instructed to bring their CPAP machine with them?     Does the patient have a defibrillator? No  If yes, what is the patient's defib device?   Was the device form faxed to the patients provider?     Is it difficult to start an IV? No    Does the patient have mobility issues that make it difficult to get on a stretcher? No    Patient Teaching  Who received the teaching - Patient? Yes    Was the  topic of stopping iron supplements taught? Yes    Was the topic of blood thinners taught?  Yes    Was the topic of diet taught? Yes    Were instructions for taking current medications taught? Yes    Was our transportation policy taught? Yes    Were instructions for the ordered laxative taught? Yes    Which RX was prescribed? Golytely.  How were the preparation instruction materials delivered?  Verbal: Yes  On Paper: Yes  eCare message: No  Email: No    General Notes:  Mailed golytely prep packet.

## 2013-07-14 NOTE — Patient Instructions (Signed)
Leading a Healthy Life-Six tips to help improve your health and wellness     . Eat well to give your body the energy it needs.   . Stay or get active.   . A healthy mind is part of a healthy body.   . Practice safe living habits.   Marland Kitchen Keep your mind and body free of harmful drugs and alcohol.   . Get regular health care.     Tip #1: Eat well to give your body the energy it needs   Your body needs nutritious foods to stay strong and healthy. Here are some general eating guidelines:   Eat real food (not processed, packaged, and already made)   Eat adequate vegetables and fruits (6 servings daily). These foods contain invaluable micronutrients, phytochemicals, antioxidants and fiber.   Choose whole grains over simple carbs. (brown rice over white rice)   Eat more fiber (legumes, fresh fruit, ground flax seeds) Minimum daily fiber intake should be 30 grams!!!   Eat healthy fats (olive oil, nuts, avocados, essential fatty acids) and avoid trans fats and high saturated fats.   Eat adequate protein (0.5 g/lb body weight daily) from lean sources including: poultry, eggs, legumes, tofu, dairy products, nuts and seeds.   Eat calcium rich foods (milk, cheese, yogurt, kale legumes, tofu, figs, sardines) 2-3 times daily. Calcium is best absorbed through food.   Eliminate the intake of refined sugar and refined carbohydrates.   Eat healthy snacks with protein source.   Keep salt intake low. Less than 1500 mg daily.   Drink adequate water-generally 64 ounces daily.   Avoid sugary drinks (juice and soda)   Minimal caffeine intake: no more than one caffeinated drink daily is recommended.    Tip #2: Stay or get active     Exercise for at least 30 minutes at a time, at least 3 times a week. Regular physical activity can help you:   . Live longer and feel better   . Be stronger and more flexible   . Build strong bones   . Prevent depression and anxiety   . Strengthen your immune system   . Maintain a healthy body weight   . Improve  brain function    Tip #3: Remember: A healthy mind is part of a healthy body    A good state of mind can help you make healthy choices. Here are a few tips for keeping your mind healthy:   . Reduce stress in your life!  Stress is one of the main causes of illness.  . Make some time every day for things that are fun and/or pleasant   . Get enough sleep. Lack of sleep reduces how well you can concentrate, increases mood swings, and raises your risk of having a car accident.   . Consider mindfulness training   . Ask your health care provider for help if you feel depressed or anxious for more than two weeks in a row.     Tip #4: Practice safe living habits     Accidents and Injuries   . Accidents and injuries are the 5th leading cause of death in the [place]   . Women under age 25 are more likely to die in motor vehicle accidents than from any other cause.   . Accidents in the home cause thousands of permanent injuries every year. The most common accidents are fires, falls, and drowning. To help yourself and your family stay safe:   Marland Kitchen Install smoke detectors  on each floor of your home.   . Make sure everyone in your family knows how to swim  . Stay safe on the road:  o Wear a seatbelt.   o Do not ride with someone who has been drinking or taking drugs.   o Do not speak on a cell phone or send, read, or write text messages while you are driving.   o Wear a helmet when you ride a bicycle or motorcycle.   o Get enough sleep at night, and do not drive when you are tired.     Hand Hygiene   Protect yourself from germs by washing your hands often. Always wash your hands:   . After you change a diaper or use the toilet   . Before you start and after you finish preparing food     Tip #5: Keep your mind and body free of harmful drugs and alcohol      Tobacco causes more health problems than any other substance. These problems include lung disease, heart disease, and many types of cancer. The nicotine in tobacco is the most  addictive and widely used drug.    Too much alcohol can cause damage to your liver, heart, brain, bones, and other body tissues. Being under the influence of alcohol also increases your chance of being injured in an accident. Alcohol can cause fetal alcohol syndrome in your children if you drink regularly when you are pregnant.    Street drugs, like marijuana, cocaine, methamphetamine, heroin, or pain pills not prescribed by your doctor can harm your health. They may be mixed with harmful substances, and using them can cause people to put themselves in dangerous situations.     Tip #6: Get regular health care     Many people think they need to see their provider only when they are sick. But, health care providers can also help you stay healthy.   . Find a health care provider who works with you to manage your health.   . Ask your health care provider what diseases you are at risk for. Learn what you can do to prevent or control them.    Get yourself and your family immunized against life-threatening diseases.   Don't save your concerns for your annual check-up. Come in as they occur.   Smile more-it makes you feel good!

## 2013-07-14 NOTE — Progress Notes (Signed)
[facility]inic Note        Shaquan Puerta is a 43 year old woman, here today for a preventive health visit/routine medical exam. Other problems, concerns, or updates today:    1.  Overall doing well and happy in her life. Girls are 14 and 16 and doing well.     2. Father diagnosed with melanoma and found early. Also has Parkinson's. Both mom and dad are visiting from [place] now.     3. Has intermittent bouts of IBS and has not tried to eliminate gluten in the past. Will consider. No red flag sx.     Patient Active Problem List   Diagnosis   . Anaphylactic reaction   . FH: colon cancer   . MVP (mitral valve prolapse)   . FH: melanoma       Past Medical History   Diagnosis Date   . Onychomycosis of toenail    . AR (allergic rhinitis)      uses zyrtec   . Asthma      rare and related to ar   . Gastritis 2000     endoscopy   . IBS (irritable bowel syndrome)      diet related   . Psoriasis      scalp-steroid   . Acne      trentinoin   . Raynaud's disease      lifelong   . MVP (mitral valve prolapse)      had chest pain and none now-echo normal   . Anaphylactic reaction      bees and wasps   . Abnormal pap      in the past and neg in 2012. No treatment   . FH: colon cancer 07/14/2013   . FH: melanoma 07/14/2013     Father and sees Derm regularly     Interval Change of PMH-NO    Family History   Problem Relation Age of Onset   . Colon Cancer Mother 48   . Parkinson's Disease Father    . Hypertension Mother    . Cancer Father      esophageal cancer   . Melanoma Father      Interval Change of FH-yes melanoma    Current Outpatient Prescriptions   Medication Sig Dispense Refill   . Cholecalciferol (VITAMIN D) 2000 UNITS Oral Cap Take 2,000 Units by mouth daily.     . Tretinoin (RETIN-A) 0.025 % External Cream apply at hs 45 g 3     No current facility-administered medications for this visit.       Review of patient's allergies indicates:  No Known Allergies    History     Social History   . Marital  Status: N/A     Spouse Name: N/A     Number of Children: N/A   . Years of Education: N/A     Social History Main Topics   . Smoking status: Never Smoker    . Smokeless tobacco: Not on file   . Alcohol Use: Not on file   . Drug Use: Not on file   . Sexual Activity:     Partners: Male     Other Topics Concern   . Not on file     Social History Narrative    Hiawatha and her family moved here from [place] several years ago. 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     Interval  Change of SH-NO    Gynecologic History  LMP: 06/28/2013  Abnormal bleeding, spotting,or discharge: No    Last pap: 2014 double net  Sexual activity: Discussed yes, single partner    Lifestyle  Dietary Habits: Reviewed and discussed briefly today  Calcium/ Vitamin D: dietary calcium/Vit D supplementation reviewed  Current exercise habits: yes, engages in regular exercise  Substance Use: see social history    Review Of Systems:  As noted above; all other systems reviewed and are negative. See scanned update form    Physical Exam:  BP 117/75  Pulse 55  Wt 142 lb 14.4 oz (64.819 kg)  LMP 06/28/2013  Breastfeeding? No  Body mass index is 21.41 kg/(m^2).  General appearance: healthy, alert, no distress, relaxed, cooperative  Neck: supple. No adenopathy. Thyroid symmetric, normal size, without nodules  Breasts: No obvious deformity or mass to inspection, no skin lesion or nipple discharge, no masses palpated, no axillary lymphadenopathy  Lungs: clear to auscultation  Heart: normal rate, regular rhythm  Abdomen: soft, non-tender. No masses or organomegaly  Skin: warm and dry with no rashes or concerning nevi   Neuro: speech normal, mental status intact, thoughts coherent, no focal deficits to observation, gait normal    History sections of chart reviewed and updated today YES    Assessment/Plan:    1. Reviewed and updated:  Health Maintenance   Topic Date Due   . COLONOSCOPY Q 5 YEARS  1972/02/06   . TETANUS  BOOSTER  01/03/1985   . INFLUENZA >9 YRS  09/13/2013   . PAP SMEAR 21-64 YEARS  03/17/2015       2.  Galia was seen today for annual exam.    Diagnoses and associated orders for this visit:    Routine general medical examination at a health care facility  -         Overall doing well, and up to date with HCM issues as of today. Excellent self-care and motivation to continue with her healthy lifestyle practices.  Will get mammo today    FH: colon cancer  Plan: REFERRAL TO COLONOSCOPY       Screening for endocrine, metabolic and immunity disorder  Plan: LIPID PANEL, BASIC METABOLIC PANEL      Immunisation due  Plan: Tdap vacc (adult) 0.5 mL IM - 96789      Preventive counseling:  health maintenance  immunizations  other drug use  adequate calcium intake  vitamin D supplementation  proper exercise    Patient Education: see patient instructions/AVS    Follow Up: 1 year    Mariana Single, [place]  [facility]   [address] [place] [address]

## 2013-07-19 MED ORDER — PEG 3350-KCL-NABCB-NACL-NASULF 236 G OR SOLR
4000.0000 mL | ORAL | Status: DC
Start: 2013-07-19 — End: 2014-07-19

## 2013-08-12 ENCOUNTER — Ambulatory Visit: Payer: No Typology Code available for payment source | Attending: Gastroenterology | Admitting: Gastroenterology

## 2013-08-12 DIAGNOSIS — Z800 Family history of malignant neoplasm of digestive organs: Secondary | ICD-10-CM | POA: Insufficient documentation

## 2013-08-31 ENCOUNTER — Encounter (HOSPITAL_BASED_OUTPATIENT_CLINIC_OR_DEPARTMENT_OTHER): Payer: Self-pay | Admitting: Dermatology

## 2013-08-31 ENCOUNTER — Ambulatory Visit: Payer: No Typology Code available for payment source | Attending: Dermatology | Admitting: Dermatology

## 2013-08-31 DIAGNOSIS — Z808 Family history of malignant neoplasm of other organs or systems: Secondary | ICD-10-CM | POA: Insufficient documentation

## 2013-08-31 DIAGNOSIS — L819 Disorder of pigmentation, unspecified: Secondary | ICD-10-CM | POA: Insufficient documentation

## 2013-08-31 DIAGNOSIS — B079 Viral wart, unspecified: Secondary | ICD-10-CM | POA: Insufficient documentation

## 2013-08-31 DIAGNOSIS — L814 Other melanin hyperpigmentation: Secondary | ICD-10-CM

## 2013-08-31 DIAGNOSIS — L219 Seborrheic dermatitis, unspecified: Secondary | ICD-10-CM

## 2013-08-31 NOTE — Progress Notes (Signed)
Do you have a history of skin cancer? NO    Personal history of melanoma? NO    Immediate family history of melanoma? YES (Father-x2 mo ago)    Would you like a full skin exam today? YES    Gown: YES      If our office needs to contact you after your visit today, is it ok to leave a detailed message on your phone? YES    What is the preferred number?     Telephone Information:   Home Phone [phone]

## 2013-08-31 NOTE — Progress Notes (Signed)
Note dictated

## 2013-09-01 NOTE — Progress Notes (Signed)
Claudia Jensen, Claudia Jensen          [id number]          08/31/2013      New patient, self referred.    CHIEF COMPLAINT     Multiple skin issues.    HISTORY OF PRESENT ILLNESS     Claudia Jensen is a 43 year old from [place] whose father was recently diagnosed with melanoma.  She is here for a skin check.  She also has other issues including mild rosacea and acne as well as warts on her right knee and right index finger.  She also has an eruption on her scalp and would like a full skin exam.    SHE HAS NO ALLERGIES.  She takes Retin-A 0.025% cream as noted.  She also uses a moisturizer on her face.    She also takes a multiple vitamin.    FAMILY HISTORY     Otherwise, mother, father, siblings, and children healthy.    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    PHYSICAL EXAMINATION     Healthy, young-appearing 43 year old with some lentigos on the upper shoulders and sun-exposed skin.  She has a patch of seborrheic dermatitis on the right posterior scalp.  She has essentially 100% clear skin on her face with no residual acne or rosacea in my opinion.  Currently using 0.025% Retin-A cream as noted.  She has flat warts on the right knee and a common wart on the right index finger.  Otherwise, skin on the face, ears, neck, arms, hands, back, chest, legs, and feet unremarkable.  Orientation, mood and affect are good.    ASSESSMENT     Rosacea acne under good care good control, a family history with father having recent melanoma, localized seborrheic dermatitis right posterior scalp, flat warts on the right knee, common wart on the right index finger, and lentigos on the upper back.    PLAN     All thoroughly explained in detail.  Skin cancer, mole, and wart handouts given.  Light liquid nitrogen to warts on right hand and knee.  Hot water therapy handout given as well.    She will recheck once a year as needed new lesions of concern or sooner if she wants further treatment of her warts including  considering salicylic acid, duct tape, and Aldara.

## 2014-01-15 ENCOUNTER — Other Ambulatory Visit: Payer: Self-pay

## 2014-07-19 ENCOUNTER — Encounter (HOSPITAL_BASED_OUTPATIENT_CLINIC_OR_DEPARTMENT_OTHER): Payer: Self-pay | Admitting: Registered Nurse

## 2014-07-19 ENCOUNTER — Ambulatory Visit: Payer: No Typology Code available for payment source | Attending: Registered Nurse | Admitting: Registered Nurse

## 2014-07-19 VITALS — BP 121/75 | HR 63 | Ht 68.75 in | Wt 137.2 lb

## 2014-07-19 DIAGNOSIS — Z87898 Personal history of other specified conditions: Secondary | ICD-10-CM

## 2014-07-19 DIAGNOSIS — Z87892 Personal history of anaphylaxis: Secondary | ICD-10-CM | POA: Insufficient documentation

## 2014-07-19 DIAGNOSIS — Z5189 Encounter for other specified aftercare: Secondary | ICD-10-CM

## 2014-07-19 DIAGNOSIS — Z0000 Encounter for general adult medical examination without abnormal findings: Secondary | ICD-10-CM

## 2014-07-19 DIAGNOSIS — L700 Acne vulgaris: Secondary | ICD-10-CM | POA: Insufficient documentation

## 2014-07-19 MED ORDER — EPINEPHRINE 0.3 MG/0.3ML IJ SOAJ
0.3000 mg | Freq: Once | INTRAMUSCULAR | Status: DC | PRN
Start: 2014-07-19 — End: 2016-07-17

## 2014-07-19 MED ORDER — TRETINOIN 0.025 % EX CREA
TOPICAL_CREAM | CUTANEOUS | Status: DC
Start: 2014-07-19 — End: 2015-07-19

## 2014-07-19 NOTE — Patient Instructions (Signed)
EKG looks great. No need for further work up unless you develop new symptoms including dizziness, fainting, exercise intolerance, or palpitations lasting longer than seconds.       Leading a Healthy Life-Six tips to help improve your health and wellness     . Eat well to give your body the energy it needs.   . Stay or get active.   . A healthy mind is part of a healthy body.   . Practice safe living habits.   Marland Kitchen Keep your mind and body free of harmful drugs and alcohol.   . Get regular health care.     Tip #1: Eat well to give your body the energy it needs   Your body needs nutritious foods to stay strong and healthy. Here are some general eating guidelines:   Eat real food (not processed, packaged, and already made)   Eat adequate vegetables and fruits (6 servings daily). These foods contain invaluable micronutrients, phytochemicals, antioxidants and fiber.   Choose whole grains over simple carbs. (brown rice over white rice)   Eat more fiber (legumes, fresh fruit, ground flax seeds) Minimum daily fiber intake should be 30 grams.   Eat healthy fats (olive oil, nuts, avocados, essential fatty acids) and avoid trans fats and high saturated fats.   Eat adequate protein (0.5 g/lb body weight daily) from lean sources including: poultry, eggs, legumes, tofu, dairy products, nuts and seeds.   Eat calcium rich foods (milk, cheese, yogurt, kale legumes, tofu, figs, sardines), three to four servings daily. Calcium is best absorbed through food.   Eliminate the intake of refined sugar and refined carbohydrates.   Eat healthy snacks with protein source.   Keep salt intake low. Less than 1500 mg daily.   Drink adequate water-generally 64 ounces daily.   Avoid sugary drinks (juice and soda)   Limit caffeine to no more than three to four cups a day    Tip #2: Stay or get active     Exercise for at least 150 minutes a week. Regular physical activity can help you:   . Live longer and feel better   . Be stronger and more flexible    . Build strong bones   . Prevent depression and anxiety   . Strengthen your immune system   . Maintain a healthy body weight   . Improve brain function and generate new brain cells  . There is growing evidence that people that sit for prolonged periods have greater health risks. If you have a desk job, try to get up at least two to three times an hour, or as much as possible throughout the day.     Tip #3: Remember: A healthy mind is part of a healthy body    A good state of mind can help you make healthy choices. Here are a few tips for keeping your mind healthy:   . Reduce stress in your life!  Stress is one of the main causes of illness.  . Make some time every day for things that are fun and/or pleasant   . Get enough sleep. Lack of sleep reduces how well you can concentrate, increases mood swings, and raises your risks of accidents  . Consider mindfulness training/meditation-10 minutes a day is all you need to meditate  . Ask your health care provider for help if you feel depressed or anxious for more than two weeks in a row.     Tip #4: Practice safe living habits  Accidents and Injuries   . Accidents and injuries are the 5th leading cause of death in the [place]   . Women under age 35 are more likely to die in motor vehicle accidents than from any other cause.   . Accidents in the home cause thousands of permanent injuries every year. The most common accidents are fires, falls, and drowning. To help yourself and your family stay safe:   Marland Kitchen Install smoke detectors on each floor of your home.   . Make sure everyone in your family knows how to swim  . Stay safe on the road:  o Wear a seatbelt.   o Do not ride with someone who has been drinking or taking drugs.   o Do not speak on a cell phone or send, read, or write text messages while you are driving.   o Wear a helmet when you ride a bicycle or motorcycle.   o Get enough sleep at night, and do not drive when you are tired.     Hand Hygiene   Protect yourself  from germs by washing your hands often. Always wash your hands:   . After you change a diaper or use the toilet   . Before you start and after you finish preparing food     Tip #5: Keep your mind and body free of harmful drugs and alcohol      Tobacco causes more health problems than any other substance. These problems include lung disease, heart disease, and many types of cancer. The nicotine in tobacco is the most addictive and widely used drug.    Too much alcohol can cause damage to your liver, heart, brain, bones, and other body tissues. Being under the influence of alcohol also increases your chance of being injured in an accident. Alcohol can cause fetal alcohol syndrome in your children if you drink regularly when you are pregnant.    Street drugs, like marijuana, cocaine, methamphetamine, heroin, or pain pills not prescribed by your doctor can harm your health. They may be mixed with harmful substances, and using them can cause people to put themselves in dangerous situations.     Tip #6: Get regular health care     Many people think they need to see their provider only when they are sick. But, health care providers can also help you stay healthy.   . Find a health care provider who works with you to manage your health.   . Ask your health care provider what diseases you are at risk for. Learn what you can do to prevent or control them.    Get yourself and your family immunized against life-threatening diseases.   Don't save your concerns for your annual check-up. Come in as they occur.

## 2014-07-19 NOTE — Progress Notes (Signed)
[facility]-Clinic Note        Claudia Jensen is a 44 year old woman, here today for a preventive health visit/routine medical exam. Other problems, concerns, or updates today:    1.Doing well and happy in her home and work life. 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     2. Reports a long term history of palpitations and has a diagnosis of MVP with normal echo in the past. Had a bout of palpitations a couple of months ago, that occurred while she was in bed and felt different to her, like her heart was sinking to her back. Lasted seconds versus minutes. No associated sx at the time or now. Exercises daily and no change in exercise tolerance. No chest pain, LE edema, dyspnea.     3. Had some gum recession and recently had gum surgery and is on doxy for now. Took amoxicillin after the surgery and got an oral and vaginal yeast infection and had it treated by her oral surgeon.    Patient Active Problem List   Diagnosis   . Anaphylactic reaction   . FH: colon cancer   . MVP (mitral valve prolapse)   . FH: melanoma       Past Medical History   Diagnosis Date   . Onychomycosis of toenail    . AR (allergic rhinitis)      uses zyrtec   . Asthma      rare and related to ar   . Gastritis 2000     endoscopy   . IBS (irritable bowel syndrome)      diet related   . Psoriasis      scalp-steroid   . Acne      trentinoin   . Raynaud's disease      lifelong   . MVP (mitral valve prolapse)      had chest pain and none now-echo normal   . Anaphylactic reaction      bees and wasps   . Abnormal pap      in the past and neg in 2012. No treatment   . FH: colon cancer 07/14/2013   . FH: melanoma 07/14/2013     Father and sees Derm regularly     Interval Change of PMH-NO    Family History   Problem Relation Age of Onset   . Colon Cancer Mother 34   . Parkinson's Disease Father    . Hypertension Mother    . Cancer Father      esophageal cancer   . Melanoma Father           Interval Change of FH-NO    Current Outpatient Prescriptions   Medication Sig Dispense Refill   . Cholecalciferol (VITAMIN D) 2000 UNITS Oral Cap Take 2,000 Units by mouth daily.     . Doxycycline Hyclate 100 MG Oral Tab Take 100 mg by mouth 2 times a day.     Marland Kitchen EPINEPHrine 0.3 MG/0.3ML Injection Solution Auto-injector Inject 0.3 mL (0.3 mg) intramuscularly one time as needed for anaphylaxis. Inject into the thigh as instructed per patient package insert. 1 each 0   . Pediatric Multivitamins-Fl (MULTI VIT/FL OR)      . Tretinoin (RETIN-A) 0.025 % External Cream apply at hs 45 g 3     No current facility-administered medications for this visit.       Review of patient's allergies indicates:  No Known Allergies  History     Social History   . Marital Status: N/A     Spouse Name: N/A   . Number of Children: N/A   . Years of Education: N/A     Social History Main Topics   . Smoking status: Never Smoker    . Smokeless tobacco: Not on file   . Alcohol Use: Yes   . Drug Use: Not on file   . Sexual Activity:     Partners: Male      Comment: partner had a vasectomy     Other Topics Concern   . None     Social History Narrative    Tenesia and her family moved here from [place] several years ago. 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 Her girls go to [employer] and [employer].     Interval Change of SH-NO    Gynecologic History  LMP: 07/10/2014  Abnormal bleeding, spotting,or discharge: No    Last pap: 2014 double neg  Sexual activity: Discussed yes, single partner, contraception - vasectomy    Lifestyle  Dietary Habits: Reviewed and discussed briefly today  Calcium/ Vitamin D: dietary calcium/Vit D supplementation reviewed  Current exercise habits: yes, engages in regular exercise  Substance Use: see social history    Review Of Systems:  As noted above; all other systems reviewed and are negative. See scanned update form    Physical Exam:  BP 121/75 mmHg  Pulse 63  Ht 5' 8.75" (1.746 m)  Wt 137 lb 3.2 oz (62.234 kg)   BMI 20.41 kg/m2  LMP 07/10/2014  Body mass index is 20.41 kg/(m^2).  General appearance: healthy, alert, no distress, relaxed, cooperative  Neck: supple. No adenopathy. Thyroid symmetric, normal size, without nodules  Breasts: No obvious deformity or mass to inspection, no skin lesion or nipple discharge, no masses palpated, no axillary lymphadenopathy  Lungs: clear to auscultation  Heart: normal rate, regular rhythm  Abdomen: soft, non-tender. No masses or organomegaly  Skin: warm and dry with no rashes or concerning nevi   Neuro: speech normal, mental status intact, thoughts coherent, no focal deficits to observation, gait normal    History sections of chart reviewed and updated today YES    Assessment/Plan:    1. Reviewed and updated:  Health Maintenance   Topic Date Due   . Influenza Vaccine (1) 09/14/2014   . Pap Smear  03/17/2015   . Colonoscopy  08/13/2018   . Tetanus Vaccine  07/15/2023   . HIV Screen  Addressed       2.  Shirlette was seen today for physical.    Diagnoses and all orders for this visit:    Routine general medical examination at a health care facility  -        Overall doing well, and up to date with HCM issues as of today. Excellent self-care and motivation to continue with her healthy lifestyle practices.        History of palpitations with no concerning new sx. Normal EKG in clinic today. See AVS  Orders:  -     ELECTROCARDIOGRAM REPORT ONLY [[id number]]; Future  -     ELECTROCARDIOGRAM TRACING ONLY [[id number]]    Acne vulgaris  Plan: Tretinoin (RETIN-A) 0.025 % External Cream      History of anaphylactic shock  Plan: EPINEPHrine 0.3 MG/0.3ML Injection Solution         Auto-injector    Patient Education: see patient instructions/AVS    Follow Up: 1 year  Mariana Single, [place]  [place]  [facility]   [address] [place] [address]

## 2014-07-25 ENCOUNTER — Encounter (HOSPITAL_BASED_OUTPATIENT_CLINIC_OR_DEPARTMENT_OTHER): Payer: No Typology Code available for payment source | Admitting: Adult Congenital Heart Disease

## 2014-07-25 DIAGNOSIS — Z87898 Personal history of other specified conditions: Secondary | ICD-10-CM

## 2014-10-11 ENCOUNTER — Encounter (HOSPITAL_BASED_OUTPATIENT_CLINIC_OR_DEPARTMENT_OTHER): Payer: Self-pay

## 2014-10-11 ENCOUNTER — Telehealth (HOSPITAL_BASED_OUTPATIENT_CLINIC_OR_DEPARTMENT_OTHER): Payer: Self-pay | Admitting: Registered Nurse

## 2014-10-11 ENCOUNTER — Ambulatory Visit: Payer: No Typology Code available for payment source | Attending: Internal Medicine | Admitting: Internal Medicine

## 2014-10-11 VITALS — BP 120/80 | HR 67 | Temp 99.3°F | Ht 68.9 in | Wt 143.5 lb

## 2014-10-11 DIAGNOSIS — N6459 Other signs and symptoms in breast: Secondary | ICD-10-CM | POA: Insufficient documentation

## 2014-10-11 DIAGNOSIS — N6012 Diffuse cystic mastopathy of left breast: Secondary | ICD-10-CM | POA: Insufficient documentation

## 2014-10-11 NOTE — Telephone Encounter (Signed)
Left Breast pain and itchiness in the nipple, has been also having pain for awhile near her left rib area. Would like to come in and be seen, with PCP or a Breast Specialist.

## 2014-10-11 NOTE — Progress Notes (Addendum)
[facility]         Chief Complaint:  Chief Complaint   Patient presents with   . Breast Problem     left nipple pain     Ms. Skolnick is a 44 year old [place] speaking female with MVP who presents today with left nipple pain and itching.     Issues Discussed:   #Left nipple pain and itching   -Pt complains of left nipple pain and itching that started this Monday without inciting breast injury/trauma  -Does not report nipple discharge, erythema or swelling  -Denies rashes or lesions on breasts or elsewhere on body   -Does report sensation of mild breast fullness but no notable change in breast size   -She has never had these symptoms before, does not note any temporal relationship to breast discomfort and menstrual cycle   -Notes that her periods are usually regular, but her most recent period came five days early  -LMP ended 09/29/2014, was not longer in duration or heavier   -Is presently not on birth control as her husband had a vasectomy, does not think that she could be pregnant. 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 Anaphylactic reaction   . FH: colon cancer   . MVP (mitral valve prolapse)   . FH: melanoma       ALLERGIES  Review of patient's allergies indicates:  No Known Allergies    Current Outpatient Prescriptions   Medication Sig Dispense Refill   . Cholecalciferol (VITAMIN D) 2000 UNITS Oral Cap Take 2,000 Units by mouth daily.     . Doxycycline Hyclate 100 MG Oral Tab Take 100 mg  by mouth 2 times a day.     Marland Kitchen EPINEPHrine 0.3 MG/0.3ML Injection Solution Auto-injector Inject 0.3 mL (0.3 mg) intramuscularly one time as needed for anaphylaxis. Inject into the thigh as instructed per patient package insert. 1 each 0   . Pediatric Multivitamins-Fl (MULTI VIT/FL OR)      . Tretinoin (RETIN-A) 0.025 % External Cream apply at hs 45 g 3     No current facility-administered medications for this visit.       Physical Examination:   BP 120/80 mmHg  Pulse 67  Temp(Src) 99.3 F (37.4 C) (Temporal)  Ht 5' 8.9" (1.75 m)  Wt 143 lb 8 oz (65.091 kg)  BMI 21.25 kg/m2  SpO2 100%    GEN - Well appearing, NAD   EYES- Sclera non-icteric, conjunctiva normal  HENT - MMM, oropharynx clear, no cervical LAD   CV- RRR, normal S1/S2, no m/g/r, no LE edema  BREAST - Left breast: no discoloration, dimpling or erythema noted, nipple non-tender to palpation, no expressible discharge from left nipple, mild tenderness to palpation of glandular tissue at 3 and 7 o clock  HEME - No left axillary or bilateral supraclavicular adenopathy    ABD- Soft, ND, NT, BS  EXTR - WWP, 2+ DP  pulses   DERM - No melanotic lesions noted over chest   Neuro - CN2-12 grossly intact, face symmetric, no dysarthria, normal gait and station  Psych - Euthymic affect, mood congruent, appropriate behavior     Assessment/Plan:    Ms. Dann is a 44 year old English speaking female with MVP who presents today with left nipple pain and itching.     #Unilateral nipple pain & itching   Ms Meharg is a 44 year old female presenting with new onset unilateral nipple pain and itching. We considered fibrocystic breast given the early onset of her menstrual cycle suggesting possible hormonal variation, though the unilateral presentation of her symptoms is unusual. No discharge, erythema or warm to suggest mastitis and no breast masses or axillary lymphadenopathy to suggest fibroadenoma or malignant mass at this time. Pt has a family history of melanoma but no  personal history of skin cancer. No melanotic lesions were noted grossly over chest and trunk today. Pt is up to date on mammography, with last mammogram in 07/2013 demonstrating dense breast tissue but no overt evidence of masses. Recommended trial of NSAIDs and oral evening primrose oil for anti-inflammatory effect. If symptoms persist greater than 10 days, can consider further evaluation with breast imaging as last mammogram was a limited study due to tissue density.     -Recommended NSAIDs and PO evening primrose oil for 7 -10 days  -Follow up in clinic if symptoms do not improve     I saw the patient with Lennox Solders, attending physician, for problem focused visit.     Susa Day, MD MPH  Internal Medicine R1

## 2014-10-11 NOTE — Patient Instructions (Addendum)
Thank you for visiting with me today. Here are the things I recommend from today's visit:    1) Left nipple pain: Today we performed a physical exam. We have a low concern for an infection or breast mass at this time. It is possible that your symptoms are from breast inflammation which can happen with hormonal changes. Please try anti-inflammatories such as ibuprofen or evening primose oil. Please give our clinic a call in 7-10 days if your symptoms do not improve. We can consider imaging or referral to breast clinic if symptoms persist or worsen.    Here are a few tips to help navigate your healthcare needs:     Refills:  Call your pharmacy at least 4 working days before you run out. Do not call the clinic for refills, it's quicker and safer to go through your pharmacy.     Test Results: Available in 1-2 weeks. I will contact you by eCare or letter unless there is something urgent, in which case I will call you sooner.     Urgent Symptoms:  Call [phone], day or night, and select option 8. Our clinic staff will help you during regular hours; after hours, our on-call nurses will help you.     Other Questions: Use eCare to securely message me. Please note that e-care messages are only read during office hours. If you have a long or complex question or a new issue, please make an appointment.  Call [phone] to sign-up for eCare or ask your MA to sign you up today

## 2014-10-11 NOTE — Telephone Encounter (Signed)
Appointment today.  Denies lump and nipple discharge.

## 2014-10-18 NOTE — Progress Notes (Signed)
I saw and evaluated the patient. I have reviewed the resident's documentation and agree with it.

## 2014-11-15 ENCOUNTER — Encounter (HOSPITAL_BASED_OUTPATIENT_CLINIC_OR_DEPARTMENT_OTHER): Payer: Self-pay | Admitting: Registered Nurse

## 2014-11-15 ENCOUNTER — Ambulatory Visit: Payer: No Typology Code available for payment source | Attending: Registered Nurse | Admitting: Registered Nurse

## 2014-11-15 VITALS — BP 125/86 | HR 63 | Wt 142.2 lb

## 2014-11-15 DIAGNOSIS — L299 Pruritus, unspecified: Secondary | ICD-10-CM | POA: Insufficient documentation

## 2014-11-15 DIAGNOSIS — Z23 Encounter for immunization: Secondary | ICD-10-CM | POA: Insufficient documentation

## 2014-11-15 DIAGNOSIS — R3915 Urgency of urination: Secondary | ICD-10-CM | POA: Insufficient documentation

## 2014-11-15 LAB — URINALYSIS WITH REFLEX CULTURE
Bilirubin (Qual), URN: NEGATIVE
Comments For Microscopic, URN: 3 — AB
Epith Cells_Renal/Trans,URN: NEGATIVE /HPF
Glucose Qual, URN: NEGATIVE mg/dL
Ketones, URN: NEGATIVE mg/dL
Leukocyte Esterase, URN: NEGATIVE
Nitrite, URN: NEGATIVE
Occult Blood, URN: NEGATIVE
Protein (Alb Semiquant), URN: NEGATIVE mg/dL
RBC, URN: NEGATIVE /HPF
Specific Gravity, URN: 1.023 g/mL (ref 1.002–1.027)
WBC, URN: NEGATIVE /HPF
pH, URN: 5.5 (ref 5.0–8.0)

## 2014-11-15 NOTE — Patient Instructions (Addendum)
Trial of daily metamucil one week before cycle    Consider bladder irritants and I will let you know if you have any bacteria in your urine    Use a two week trial of hydrocortisone to the nipples twice a day and message me in two weeks to let me know if your symptoms are not better in which case I will order the mammo and [place]

## 2014-11-15 NOTE — Progress Notes (Signed)
[facility] Note      Chief Complaint   Patient presents with   . Follow Up Visit     changes in period frequency, nipple itchyness   . Flu Shot       Issues Discussed/HPI:    1. She has noted a one month history of intermittent nipple itchiness without associated change in appearance. In conjunction with this, she has noted some changes in her bms around her menses, with increase in frequency several days before period, then mild constipation after menses. Menses are otherwise monthly and without intermittent bleeding. No new soaps, laundry detergent, or lotions. Diet has changed a bit with family in town over the summer. She has also noted a couple of times of loss of small amounts of urine despite daily Kegels of around two hundred per day.      ROS: no breast masses, pain, nipple discharge, She denies frequency, dysuria, hematuria, pelvic pain.     Patient Active Problem List   Diagnosis   . Anaphylactic reaction   . FH: colon cancer   . MVP (mitral valve prolapse)   . FH: melanoma       Current Outpatient Prescriptions   Medication Sig Dispense Refill   . Cholecalciferol (VITAMIN D) 2000 UNITS Oral Cap Take 2,000 Units by mouth daily.     . Doxycycline Hyclate 100 MG Oral Tab Take 100 mg by mouth 2 times a day.     Marland Kitchen EPINEPHrine 0.3 MG/0.3ML Injection Solution Auto-injector Inject 0.3 mL (0.3 mg) intramuscularly one time as needed for anaphylaxis. Inject into the thigh as instructed per patient package insert. 1 each 0   . Pediatric Multivitamins-Fl (MULTI VIT/FL OR)      . Tretinoin (RETIN-A) 0.025 % External Cream apply at hs 45 g 3     No current facility-administered medications for this visit.       Review of patient's allergies indicates:  No Known Allergies    Physical Exam:  BP 125/86 mmHg  Pulse 63  Wt 142 lb 3.2 oz (64.501 kg)  LMP 10/24/2014  Breastfeeding? No  Constitutional: Appearance: Well developed, appearing stated age and in no acute distress  Breasts: Inspection:   Normal breast symmetry.  Normal nipples, without discharge.  No skin lesions.  Palpation:  Normal breast tissue bilaterally, no tenderness, no suspicious masses.    Axillary nodes: No adenopathy.    Lab review: UA sent with reflexive culture    Assessment and Plan:  Tremeka was seen today for follow up visit and flu shot.    Diagnoses and all orders for this visit:    Urgency of urination with recent history of incontinence           -     Urinalysis with Reflex Culture and handout on bladder irritants given    Needs flu shot  -     influenza vaccine quadrivalent PF (adult) 0.5 mL IM - [id number]    Itchy skin of the nipples            -    She will use hydrocortisone to the nipples for the next two weeks and if not better, will let me know and I will                     order a diagnostic mammo and [place].     Patient Ed:               -  Bladder irritants and dermatitis reviewed. Will keep me posted on sx and let me know if not better. Also discussed metamucil for bowel changes around menses and that this is not unusual with hormonal fluctuations.     Follow-up:  As above    Greater than 50% of this visit was spent face to face with patient in counseling regarding rationale for medical decision making and answering questions the patient had. Total time spent was over 15 minutes.     [place], [place]  [place]  [address] [place] [address]

## 2015-02-06 ENCOUNTER — Encounter (HOSPITAL_BASED_OUTPATIENT_CLINIC_OR_DEPARTMENT_OTHER): Payer: Self-pay | Admitting: Registered Nurse

## 2015-02-06 DIAGNOSIS — N6459 Other signs and symptoms in breast: Secondary | ICD-10-CM

## 2015-02-07 ENCOUNTER — Other Ambulatory Visit: Payer: Self-pay | Admitting: Registered Nurse

## 2015-02-07 ENCOUNTER — Ambulatory Visit (HOSPITAL_BASED_OUTPATIENT_CLINIC_OR_DEPARTMENT_OTHER): Admit: 2015-02-07 | Discharge: 2015-02-07 | Disposition: A | Discharge status: Home / Self Care | Payer: Self-pay

## 2015-02-07 DIAGNOSIS — N6459 Other signs and symptoms in breast: Secondary | ICD-10-CM

## 2015-02-07 NOTE — Telephone Encounter (Signed)
Diagnostic unilateral mammogram on 02/12/15.

## 2015-02-07 NOTE — Telephone Encounter (Signed)
Per 10/11/2014 Office Visit A/P  Assessment/Plan:    Claudia Jensen is a 45 year old [place] speaking female with MVP who presents today with left nipple pain and itching.     #Unilateral nipple pain & itching   Claudia Jensen is a 45 year old female presenting with new onset unilateral nipple pain and itching. We considered fibrocystic breast given the early onset of her menstrual cycle suggesting possible hormonal variation, though the unilateral presentation of her symptoms is unusual. No discharge, erythema or warm to suggest mastitis and no breast masses or axillary lymphadenopathy to suggest fibroadenoma or malignant mass at this time. Pt has a family history of melanoma but no personal history of skin cancer. No melanotic lesions were noted grossly over chest and trunk today. Pt is up to date on mammography, with last mammogram in 07/2013 demonstrating dense breast tissue but no overt evidence of masses. Recommended trial of NSAIDs and oral evening primrose oil for anti-inflammatory effect. If symptoms persist greater than 10 days, can consider further evaluation with breast imaging as last mammogram was a limited study due to tissue density.     -Recommended NSAIDs and PO evening primrose oil for 7 -10 days  -Follow up in clinic if symptoms do not improve     I saw the patient with Lennox Solders, attending physician, for problem focused visit.     Susa Day, MD MPH  Internal Medicine R1

## 2015-02-12 ENCOUNTER — Ambulatory Visit: Payer: No Typology Code available for payment source | Attending: Registered Nurse

## 2015-02-12 ENCOUNTER — Ambulatory Visit (HOSPITAL_BASED_OUTPATIENT_CLINIC_OR_DEPARTMENT_OTHER): Payer: No Typology Code available for payment source

## 2015-02-12 DIAGNOSIS — N6459 Other signs and symptoms in breast: Secondary | ICD-10-CM

## 2015-03-22 ENCOUNTER — Ambulatory Visit: Payer: No Typology Code available for payment source | Attending: Dermatology | Admitting: Dermatology

## 2015-03-22 ENCOUNTER — Encounter (HOSPITAL_BASED_OUTPATIENT_CLINIC_OR_DEPARTMENT_OTHER): Payer: Self-pay | Admitting: Dermatology

## 2015-03-22 DIAGNOSIS — Z808 Family history of malignant neoplasm of other organs or systems: Secondary | ICD-10-CM | POA: Insufficient documentation

## 2015-03-22 DIAGNOSIS — B078 Other viral warts: Secondary | ICD-10-CM | POA: Insufficient documentation

## 2015-03-22 DIAGNOSIS — L601 Onycholysis: Secondary | ICD-10-CM | POA: Insufficient documentation

## 2015-03-22 DIAGNOSIS — Z1283 Encounter for screening for malignant neoplasm of skin: Secondary | ICD-10-CM

## 2015-03-22 DIAGNOSIS — D229 Melanocytic nevi, unspecified: Secondary | ICD-10-CM

## 2015-03-22 DIAGNOSIS — L814 Other melanin hyperpigmentation: Secondary | ICD-10-CM | POA: Insufficient documentation

## 2015-03-22 DIAGNOSIS — L409 Psoriasis, unspecified: Secondary | ICD-10-CM | POA: Insufficient documentation

## 2015-03-22 NOTE — Patient Instructions (Signed)
Preventing Skin Cancer  Relaxing in the sun may feel good. But it isn't good for your skin. In fact, being exposed to the sun's harmful rays is a major cause of skin cancer.  People of all ages and backgrounds are at risk.     Your role in prevention  You can act today to help prevent skin cancer. Start by avoiding the sun's UV (ultraviolet) rays. And don't use tanning beds, which are no safer than the sun. Taking these steps can help keep you from getting skin cancer. It can also help prevent wrinkles and other sun-induced aging effects. Make sure your children also follow these safeguards. Now is the time to start taking preventive steps against skin cancer.    When you are outdoors  Protect your skin when you go outdoors during the day.    Wear tightly woven clothing that covers your skin. Put on a wide-brimmed hat to protect your face, ears, and scalp.   Watch the clock. Try to avoid the sun between 10 a.m. and 4 p.m., when it is strongest.   Head for the shade or create your own. Use an umbrella when sitting or strolling.   Know that the sun's rays can reflect off sand, water, and snow. This can harm your skin. Take extra care when you are near reflective surfaces.   Keep in mind that even when the weather is hazy or cloudy, your skin can be exposed to strong UV rays.   Shield your skin with sunscreen. Also, apply sunscreen to your children's skin.    Tips for using sunscreen  To help prevent skin cancer, choose the right sunscreen and use it correctly. Try the following tips:   Choose a sunscreen that has a sun protection factor (SPF) of at least 30. Also, choose a sunscreen labeled "broad spectrum." This will shield you from both UVA and UVB (ultraviolet A and B) rays.   If one brand irritates your skin, try another   Use a water-resistant sunscreen if swimming or sweating.   Use at least an ounce of sunscreen (enough to fill a shotglass) to cover exposed areas. You might need to adjust the amount  depending on your body size.   Apply the sunscreen to dry skin about 15 minutes before going outdoors to give it time to be absorbed.   Reapply sunscreen every2 hours. If you're active or in the water, do this more often.   Cover any sun-exposed skin, from your face to your feet. Don't forget your ears and your lips.   Know that while sunscreen helps protect you, it isn't enough. Sunscreens extend the length of time you can be outdoors before your skin begins to redden, but they don't give you total protection. Using sunscreen doesn't mean you can stay out in the sun indefinitely, since damage to the skin cells is still occurring. You should also wear protective clothing. And try to stay out of the sun as much as you can, especially from 10 a.m. to 4 p.m.  Adapted from:   2000-2015 [employer] [address], [place] [address]. All rights reserved. This information is not intended as a substitute for professional medical care. Always follow your healthcare professional's instructions.            Changes in Moles  See your health care provider if your moles hurt, itch, ooze, bleed, thicken, or become crusty. Call your health care provider if your moles show signs of melanoma.   These include a mole that has:     Asymmetry. The sides of the mole don't match   Border. The edges are ragged, notched, or blurred   Color. The color within the mole varies   Diameter. The mole is larger than 6 mm (size of a pencil eraser)   Evolving. The mole is getting larger or the shape or color of the mole is changing    Adapted from:   2000-2015 [employer] [address], [place] [address]. All rights reserved. This information is not intended as a substitute for professional medical care. Always follow your healthcare professional's instructions.

## 2015-03-22 NOTE — Progress Notes (Signed)
I, Nicholas L Compton, MD, personally performed the services described in this documentation, as scribed by Jesse Chin in my presence, and it is both accurate and complete.

## 2015-03-22 NOTE — Progress Notes (Signed)
[facility] [place]  [place]  [address]  TEL: [phone]  l  FAX: [phone]      03/22/2015    PRIMARY CARE PROVIDER:  Thea Silversmith, [address] [id number]  [place] [address]    PATIENT: Claudia Jensen    [id number]    IDENTIFICATION/CHIEF CONCERN:    Claudia Jensen is a 45 year old female return patient seen today for continued management of "psoriasis".     DERMATOLOGY PROBLEM LIST AND TREATMENTS:  Lentigo   Wart   FHx of melanoma   Psoriasis    DERMATOLOGIC HISTORY OF PRESENT ILLNESS:  Claudia Jensen was last seen on 08/31/2013 by. Dr. Elvera Bicker during which warts on the right hand and knee were treated with LN2. She was counseled on salicylic acid, duct tape, and Aldara. She was given hot water therapy information. Her skin exam was unremarkable for cancer. Her father had just been diagnosed with melanoma. She was using Retin-A 0.025%.      Since the last visit she has been doing well.  She continues to have "warts" on her hand that date back to before 2015.  It started on her thumb, but then spread to her hands.  She was soaking in apple cider vinegar for 10 min and then duct tapping them overnight.  It would take a few months before they would disappear.      She has an issue with her toenail, it gets crumbly and breaks off.  She's had this issue for at least 5 years.  She did not try oral medications.  She has not had a culture of it.  Her father tried the oral medication and got very sick on it, but he had drank on the medication.  Her husband had "athlete's foot".    She had one spot on her face last week, but it has resolved.  She complies with sun protection.    She has "psoriasis".  It is always on the scalp "and extends down."  Her hairdresser recommended to always dry her hair with a hair dryer.  Since starting that it has improved.  However, she did not take a hair dryer with her on  vacation in [place] and it has started to act up.  Her sister has "psoriasis" in the traditional areas.    ROS:  Gen - feels well, no fevers/chills/recent illnesses/unintended weight loss  Skin - no other skin complaints  Heme/lymph - no new lumps or bumps under the skin    SOCIAL HISTORY:  Recently vacationed in [place].  Works as a Community education officer. Grew up in [place].  She reports that she has never smoked. She has never used smokeless tobacco. She reports that she drinks alcohol. She reports that she does not use illicit drugs.    FAMILY HISTORY:  Father with melanoma     PHYSICAL EXAMINATION:  Vital Signs: There were no vitals taken for this visit.  General: She is a well appearing adult female in no acute distress.  Normal development, and nutrition.  Normal affect and mood, without barriers to understanding.  Skin: Exam performed included scalp, hair, face, neck, chest, abdomen, back, both arms, both legs, hands and feet, groin and buttocks. Exam shows:    Numerous round to oval tan to light brown macules in sun exposed skin c/w solar lentigines on the shoulders  Right anterior shoulder - 6 mm firm dermal papule with subtle overlying erythema and positive dimple sign, no obvious punctum  Right posterior scalp extending to occipital scalp - light pink plaque with silvery scale  Skin-colored papule on right knee  Right great toenail - shortened with some distal onycholysis yellowed and thickened  Erythema and scale in 3rd and 4th web spaces  Plantar scale on right foot, absent on the left  Volar surfaces of hand hyperkeratotic papules with skin line disruption on left distal fat pad, left index finger just proximal to PIP, Right middle finger between MCP and PIP x 2, and x 1 at base of left thumb, x 1 radial side of left thumb  Scattered small uniform medium brown macules - extremities     IMPRESSION/PLAN:  The following was discussed with the patient.  Recommendations are as follows:    1. FH: melanoma  No  concerning lesions on exam. Recommended regular skin exams and sun protection. Observe closely for skin damage/changes, and call if such occurs.    2. Psoriasis  Her total by surface area is less than 1% and is entirely manifested on her scalp.  It seems to be under fairly good control with occasional tea tree oil.  If in the future she would like a more aggressive therapy we can try all topical steroids last car based or ketoconazole based shampoo.    3. Skin cancer screening  No concerning lesions on exam. Recommended regular skin exams and sun protection. Observe closely for skin damage/changes, and call if such occurs.    4. Solar lentiginosis  Petra Kuba of condition discussed with patient. Benign reassurance, no treatment needed today.  Discussed sun protection.    5. Multiple melanocytic nevi  Benign reassurance, none appear concerning on exam today. No treatment needed. Advised to monitor for changes in color, size, and symptoms.    6. Other viral warts  Petra Kuba of condition discussed with patient..  Discussed LN2 vs. Injection.  The trick with treating warts is patient's persistence.  I'll see her back regularly for treatment..    - INJECTION INTRALESIONAL UP TO & INCLUD 7 LESIONS    7. Onycholysis  Petra Kuba of condition discussed with patient including lack of cure. Benign reassurance, no treatment needed today.  Discussed oral medications, paced that.      Return to clinic in 3-5 weeks or sooner PRN acute concerns.    Intralesional Candida Injection    Diagnosis: Warts  Site(s): Left index finger, right middle finger x 2, x 1 base of left thumb, x 1 radial side of left thumb    The risks of pain, exuberant dermatitis, bleeding, and ineffectiveness was explained to the patient. The potential benefit of improving the condition was explained to the patient. The patient agreed to the procedure after being informed of the risks and benefits. Procedure 0.2 mL of candida antigen was injected directly into the affected  area. There were no complications and the patient tolerated the procedure well.    I, Kris Hartmann, Medical Scribe, documenting for and in the presence of, Lily Lovings, MD, who will provide the final review and authentication.

## 2015-03-22 NOTE — Progress Notes (Signed)
Do you have a history of skin cancer? NO    Personal history of melanoma? NO    Immediate family history of melanoma? YES-- Father    Would you like a full skin exam today? YES    Gown: YES    Comments:       If our office needs to contact you after your visit today, is it ok to leave a detailed message on your phone? YES    What is the preferred number? [phone]

## 2015-04-04 ENCOUNTER — Encounter (HOSPITAL_BASED_OUTPATIENT_CLINIC_OR_DEPARTMENT_OTHER): Payer: Self-pay

## 2015-04-04 ENCOUNTER — Ambulatory Visit: Payer: No Typology Code available for payment source | Attending: Internal Medicine | Admitting: Internal Medicine

## 2015-04-04 VITALS — BP 134/74 | HR 69 | Temp 99.0°F

## 2015-04-04 DIAGNOSIS — J309 Allergic rhinitis, unspecified: Secondary | ICD-10-CM | POA: Insufficient documentation

## 2015-04-04 DIAGNOSIS — J45909 Unspecified asthma, uncomplicated: Secondary | ICD-10-CM | POA: Insufficient documentation

## 2015-04-04 DIAGNOSIS — G44229 Chronic tension-type headache, not intractable: Secondary | ICD-10-CM | POA: Insufficient documentation

## 2015-04-04 DIAGNOSIS — I7300 Raynaud's syndrome without gangrene: Secondary | ICD-10-CM | POA: Insufficient documentation

## 2015-04-04 NOTE — Progress Notes (Signed)
[facility] - Internal Medicine  Acute Care Visit      ID/CC:  45 year old woman, patient of Thea Silversmith, [place] ARNP, presents to [facility] with pain around L ear.    HPI:   L-sided ear pain x several days in late February, so on Wed 3/1 she went to [facility], was told there was mild inflammation & tonsilloliths; she has a Hx of tonsillitis & tonsilloliths.  Early on, she tried an antihistamine w/o benefit.    Since then, discomfort has spread to L jaw, L side of head behind the ear, also behind L eye.      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    Feels warm right now (unusual for her).  Has been sweating.  Past few days she's had some nasal congestion - it's been thick  Two weeks ago, for 3 days, she was smelling bleach but noone else did  No change in taste; appetite is good  No pain with chewing, but she has crunching in jaw joints longterm    Hx allergies & sinus infections.  Hx tonsillitis & tonsilloliths    She lifts weights.  Is a physical therapist.      Physical exam: BP 134/74 mmHg  Pulse 69  Temp(Src) 99 F (37.2 C) (Temporal)  General:  Well-developed, slender & fit woman in no acute distress.  HEENT: Oropharynx: Normal tonsils, single tonsillolith on R (small, noninflamed)    Ear: B TM & canals normal, no pain with traction on pinna, nor over mastoid    Jaw: Palpable click but no pain    Sinuses: Reduced transillumination B  Neck: Musculature in posterior neck & upper trapezii is quite tight B (L perhaps a bit more than R)      Assessment & Plans:  Yasira Demanche was seen today for pain around L ear, without clear etiology but with involvement in a fairly large area & with tight neck muscles I think tension-type HA is possible.  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  Consider CT if so?

## 2015-04-12 ENCOUNTER — Ambulatory Visit: Payer: No Typology Code available for payment source | Attending: Dermatology | Admitting: Dermatology

## 2015-04-12 ENCOUNTER — Encounter (HOSPITAL_BASED_OUTPATIENT_CLINIC_OR_DEPARTMENT_OTHER): Payer: Self-pay | Admitting: Dermatology

## 2015-04-12 DIAGNOSIS — B078 Other viral warts: Secondary | ICD-10-CM | POA: Insufficient documentation

## 2015-04-12 NOTE — Progress Notes (Signed)
[facility] at [place]  l  [address] [place]  [address]  TEL: [phone]  l  FAX: [phone]      04/12/2015    PRIMARY CARE PROVIDER:  Thea Silversmith, [id number]  [place] [address]    PATIENT: Claudia Jensen    [id number]    IDENTIFICATION/CHIEF CONCERN:    Claudia Jensen is a 45 year old female return patient seen today for continued management of warts on hands.     DERMATOLOGY PROBLEM LIST AND TREATMENTS:  FHx of melanoma    Psoriasis   Lentigos   Warts   Onycholysis     Candida Hx:  Candida antigen  03/22/2015 - 0.2 ml    DERMATOLOGIC HISTORY OF PRESENT ILLNESS:  Shaina Mahan was last seen on 03/22/2015 at which time her pso (BSA < 1%) was entrirely on the scalp and under fairly good control with occasional tea tree oil. During that visit warts (5 - L index, R middle x 2, L thumb base, L thumb radial) were treated with candida injection 0.2 ml.     Since the last visit she tolerated the injection well.  The finger injected (left index) turned black at the site and started to scab.  The finger did not itch.  She has not noticed any changes in the others.      SOCIAL HISTORY:  Recently vacationed in [place]. Teaches physical therapy assistants. Grew up in [place]. She reports that she has never smoked. She has never used smokeless tobacco. She reports that she drinks alcohol. She reports that she does not use illicit drugs.     FAMILY HISTORY:  Father with melanoma     PHYSICAL EXAMINATION:  Vital Signs: There were no vitals taken for this visit.  General: She is a well appearing adult female in no acute distress.  Normal development, and nutrition.  Normal affect and mood, without barriers to understanding.  Skin: Exam performed included hands. Exam was remarkable for:    Volar surface of the left thumb - 4 x 2.5 mm  Radial side left thumb - 3 mm  Volar surface left index - 3 x 3 mm  Left middle - 2 mm  Right  middle x 2 - larger is 3 mm, smaller is just above 1 mm  Base of right thumb - 3 mm    IMPRESSION/PLAN:  The following was discussed with the patient.  Recommendations are as follows:    1. Other viral warts  The first injection of candida antigen was well-tolerated.  She did not experience any itching following the injection.  The site turned black and started to scab, but she did not notice any size changes.  Candida antigen injection offered today, patient accepts.  Today we injected the volar surface of the left index finger again.    Return to clinic in 3-5 or sooner PRN acute concerns.    Intralesional Candida Injection    Diagnosis: Warts  Site(s): left thumb volar surface    The risks of pain, exuberant dermatitis, bleeding, and ineffectiveness was explained to the patient The potential benefit of improving the condition was explained to the patient. The patient agreed to the procedure after being informed of the risks and benefits. Procedure 0.18 mL of candida antigen was injected directly into the affected area. There were no complications and the patient tolerated the procedure  well.    I, Kris Hartmann, Medical Scribe, documenting for and in the presence of, Lily Lovings, MD, who will provide the final review and authentication.

## 2015-04-12 NOTE — Progress Notes (Signed)
I, Nicholas L Compton, MD, personally performed the services described in this documentation, as scribed by Jessie Chin in my presence, and it is both accurate and complete.

## 2015-05-08 ENCOUNTER — Ambulatory Visit: Payer: No Typology Code available for payment source | Attending: Registered Nurse | Admitting: Registered Nurse

## 2015-05-08 VITALS — BP 123/75 | HR 56 | Temp 98.2°F | Wt 142.0 lb

## 2015-05-08 DIAGNOSIS — R1032 Left lower quadrant pain: Secondary | ICD-10-CM | POA: Insufficient documentation

## 2015-05-08 DIAGNOSIS — R194 Change in bowel habit: Secondary | ICD-10-CM | POA: Insufficient documentation

## 2015-05-08 DIAGNOSIS — R5383 Other fatigue: Secondary | ICD-10-CM | POA: Insufficient documentation

## 2015-05-08 LAB — CBC (HEMOGRAM)
Hematocrit: 39 % (ref 36–45)
Hemoglobin: 12.9 g/dL (ref 11.5–15.5)
MCH: 30.4 pg (ref 27.3–33.6)
MCHC: 32.8 g/dL (ref 32.2–36.5)
MCV: 93 fL (ref 81–98)
Platelet Count: 174 10*3/uL (ref 150–400)
RBC: 4.24 10*6/uL (ref 3.80–5.00)
RDW-CV: 12.2 % (ref 11.6–14.4)
WBC: 3.97 10*3/uL — ABNORMAL LOW (ref 4.3–10.0)

## 2015-05-08 LAB — PR URINE PREGNANCY TEST HCG, ONSITE: Pregnancy (HCG) (UWNC), URN: NEGATIVE

## 2015-05-08 LAB — CA 125 V2: CA 125 v2: 27 U/mL (ref 0–35)

## 2015-05-08 LAB — THYROID STIMULATING HORMONE: Thyroid Stimulating Hormone: 1.438 u[IU]/mL (ref 0.400–5.000)

## 2015-05-08 NOTE — Progress Notes (Signed)
[facility] Note      Chief Complaint   Patient presents with   . Follow Up Visit     pelvic pain and changes in bowel movement       Issues Discussed/HPI:    1. She is in with a one month history of  painful intercourse and pelvic pain on the LLQ. Four nights ago,  this pain woke her and referred to the vagina. Was able to fall back asleep without medication. Pain is present outside of intercourse and a dull ache. Husband has had vasectomy and her cycles are coming at regular intervals. She is about to start her menses and reports that she has noted more blood and chunks of tissue like substance with menses now. Not too painful. Pos history of ovarian cyst in the past. Had an [place] which revealed some type of abnormality. No vaginal discharge, urinary sx, fever or chills    2. She has noticed a change in bowel habits and has noted increase in gas. She is now moving her bowels around three times a day and they are soft and formed. She has changed her diet to vegan but his preceded the vegan diet. No melena, BRBPR, abdominal pain, but does note quite a bit of increase in gas and bloating. Feels full more often, Wonders about food allergies since this occurred with no known triggering event.     2. She is feeling tired and is not able to run the same as she has in the past. Endorses heavy menses and is sleeping well. As above, heavy menses and occasionally uses iron around her menses.       Patient Active Problem List   Diagnosis   . Anaphylactic reaction   . FH: colon cancer   . MVP (mitral valve prolapse)   . FH: melanoma   . AR (allergic rhinitis)   . Asthma   . Raynaud's disease       Current Outpatient Prescriptions   Medication Sig Dispense Refill   . Cholecalciferol (VITAMIN D) 2000 UNITS Oral Cap Take 2,000 Units by mouth daily.     . Doxycycline Hyclate 100 MG Oral Tab Take 100 mg by mouth 2 times a day.     Marland Kitchen EPINEPHrine 0.3 MG/0.3ML Injection Solution Auto-injector Inject 0.3 mL (0.3 mg)  intramuscularly one time as needed for anaphylaxis. Inject into the thigh as instructed per patient package insert. 1 each 0   . Pediatric Multivitamins-Fl (MULTI VIT/FL OR)      . Tretinoin (RETIN-A) 0.025 % External Cream apply at hs 45 g 3     No current facility-administered medications for this visit.       Review of patient's allergies indicates:  Allergies   Allergen Reactions   . Bee Venom Anaphylaxis   . Wasp Venom Anaphylaxis       Physical Exam:  BP 123/75 mmHg  Pulse 56  Temp(Src) 98.2 F (36.8 C) (Temporal)  Wt 142 lb (64.411 kg)  LMP 04/12/2015  Constitutional: Appearance: Well developed, appearing stated age and in no acute distress  Pelvic: Vulva is without lesions or discharge. Urethra appears normal. Vagina well rugated with scant discharge noted. No lesions or erythema. Cervix without mucopus or friability. Mild uterine tenderness with movement. Fullness on the left side in the adnexa. Right side is normal and without tenderness    Lab review: orders written for new lab studies as appropriate; see orders.     Assessment and Plan:  Lorra was seen today for follow up visit.    Diagnoses and all orders for this visit:    LLQ pain and dyspareunia          -     Advised no intercourse for now          -      Discussed the differential and will get [place] for both the pain and the gassy sx.   -     US PELVIS COMPLETE  -     URINE PREGNANCY TEST HCG, ONSITE  -     CA 125 V2    Fatigue, unspecified type  -     THYROID STIMULATING HORMONE  -     CBC (HEMOGRAM)    Change in bowel habit        -       Can reintroduce dairy and do a trial of gluten elimination. No pain, loose stool or change in color of stool  -     Limited Food Allergy Panel    Follow-up:  Via e[facility]    Greater than 50% of this visit was spent face to face with patient in counseling,   coordination of care,  discussing treatment options, and  education regarding above problems. Total time spent was over 15 minutes.     Cole Camp,  [place]  [place]  [address] [place] [address]

## 2015-05-09 LAB — LIMITED FOOD ALLERGY PANEL
Egg White, IgE Result: <0.1 k[IU]/L (ref 0.00–0.34)
Milk, IgE Result: <0.1 k[IU]/L (ref 0.00–0.34)
Peanut, IgE Result: <0.1 k[IU]/L (ref 0.00–0.34)
Soy, IgE Result: <0.1 k[IU]/L (ref 0.00–0.34)
Wheat, IgE Result: <0.1 k[IU]/L (ref 0.00–0.34)

## 2015-05-10 ENCOUNTER — Ambulatory Visit: Payer: No Typology Code available for payment source | Attending: Registered Nurse

## 2015-05-10 DIAGNOSIS — R1032 Left lower quadrant pain: Secondary | ICD-10-CM | POA: Insufficient documentation

## 2015-05-10 DIAGNOSIS — N941 Unspecified dyspareunia: Secondary | ICD-10-CM | POA: Insufficient documentation

## 2015-05-15 ENCOUNTER — Encounter (HOSPITAL_BASED_OUTPATIENT_CLINIC_OR_DEPARTMENT_OTHER): Payer: Self-pay | Admitting: Dermatology

## 2015-05-15 ENCOUNTER — Ambulatory Visit: Payer: No Typology Code available for payment source | Attending: Dermatology | Admitting: Dermatology

## 2015-05-15 DIAGNOSIS — B078 Other viral warts: Secondary | ICD-10-CM | POA: Insufficient documentation

## 2015-05-15 NOTE — Progress Notes (Signed)
If our office needs to contact you after your visit today, is it ok to leave a detailed message on your phone? YES    What is the preferred number? [phone]

## 2015-05-15 NOTE — Progress Notes (Signed)
[facility] at [place]  l  [place]  [address]  TEL: [phone]  l  FAX: [phone]      05/15/2015    PRIMARY CARE PROVIDER:  Thea Silversmith, [place] [place] [id number]  [place] [address]    PATIENT: Claudia Jensen    [id number]    IDENTIFICATION/CHIEF CONCERN:    Claudia Jensen is a 45 year old female return patient seen today for continued management of warts.     DERMATOLOGY PROBLEM LIST AND TREATMENTS:  FHx of melanoma    Psoriasis   Lentigos   Warts   Onycholysis     Intralesional Candida Hx:  Candida antigen  05/15/2015 - 0.2 ml (base of right thumb)  04/12/2015 - 0.18 ml (left volar thumb)  03/22/2015 - 0.2 ml (left volar thumb)    DERMATOLOGIC HISTORY OF PRESENT ILLNESS:  Claudia Jensen was last seen on 04/12/2015 during which she had her second candida injection 0.18 ml on the left volar thumb.    Since the last visit the candida injection was well-tolerated.  She denies itching.  The area was a little red.      SOCIAL HISTORY:  Recently vacationed in [place]. Teaches physical therapy assistants. Grew up in [place]. She reports that she has never smoked. She has never used smokeless tobacco. She reports that she drinks alcohol. She reports that she does not use illicit drugs.     FAMILY HISTORY:  Father with melanoma     PHYSICAL EXAMINATION:  Vital Signs: Last menstrual period 04/12/2015.  General: She is a well appearing adult female in no acute distress.  Normal development, and nutrition.  Normal affect and mood, without barriers to understanding.  Skin: Exam performed included hands and fingernails.  Exam was remarkable for:    Volar surface of left index finger proximal to PIP - 0.3 mm  Left middle finger at the PIP - 0.2 mm  Right middle finger x 2 - larger just under 3 mm, smaller 1 mm  Base of right thumb - 3 mm    IMPRESSION/PLAN:  The following was discussed with the patient.  Recommendations are as  follows:    1. Other viral warts  Today is her 3rd candida injection.  Continue with injections as long as improvement is seen.  Intralesional candida offered today, patient accepts.    Return to clinic in 3-5 or sooner PRN acute concerns.    Intralesional Candida Injection    Diagnosis: Warts  Site(s): Base of right thumb    The risks of pain, exuberant dermatitis, bleeding, and ineffectiveness was explained to the patient. The potential benefit of improving the condition was explained to the patient. The patient agreed to the procedure after being informed of the risks and benefits. Procedure 0.2 mL of candida antigen was injected directly into the affected area. There were no complications and the patient tolerated the procedure well.    I, Kris Hartmann, Medical Scribe, acted as a Education administrator in the presence of the provider indicated below and documented the service or procedure performed. To the best of my knowledge, I recorded what was dictated by the provider, Lily Lovings, MD, who will provide the final review and authentication.    Signed: Kris Hartmann, Medical Scribe  Date: 05/15/2015  Time: 10:02 AM

## 2015-05-15 NOTE — Progress Notes (Signed)
I, Nicholas L Compton, MD, personally performed the services described in this documentation, as scribed by Jessie Chin in my presence, and it is both accurate and complete.

## 2015-05-21 ENCOUNTER — Encounter (INDEPENDENT_AMBULATORY_CARE_PROVIDER_SITE_OTHER): Payer: Self-pay | Admitting: Otolaryngology

## 2015-05-21 ENCOUNTER — Ambulatory Visit (INDEPENDENT_AMBULATORY_CARE_PROVIDER_SITE_OTHER): Payer: No Typology Code available for payment source | Admitting: Otolaryngology

## 2015-05-21 DIAGNOSIS — G501 Atypical facial pain: Secondary | ICD-10-CM

## 2015-05-21 DIAGNOSIS — J309 Allergic rhinitis, unspecified: Secondary | ICD-10-CM

## 2015-05-21 NOTE — Patient Instructions (Signed)
Anatomy of the Ear    The ear is a complex and delicate organ. It collects sound waves so you can hear the world around you. The ear also has a second function-it helps you keep your balance. Your ear can be divided into 3 parts. The outer ear and middle ear help collect and amplify sound. The inner ear converts sound waves to messages that are sent to the brain. The inner ear also senses the movement and position of your head and body so you can maintain your balance and see clearly, even when you change positions.  The mastoid bone surrounds the middle ear. The external ear collects sound waves. The ear canal carries sound waves to the eardrum. The eardrum vibrates from sound waves, setting the middle ear bones in motion. The middle ear bones (ossicles) vibrate, transmitting sound waves to the inner ear. When the ear is healthy, air pressure remains balanced in the middle ear. The eustachian tube helps control air pressure in the middle ear.The semicircular canals help maintain balance. The vestibular nerve carries balance signals to the brain. The auditory nerve carries sound signals to the brain. The cochlea picks up sound waves and makes nerve signals.     2000-2016 [employer] [address], [place] [address]. All rights reserved. This information is not intended as a substitute for professional medical care. Always follow your healthcare professional's instructions.

## 2015-05-21 NOTE — Progress Notes (Signed)
05/21/2015    OFFICE VISIT      PATIENT:  Claudia Jensen  DOB:  [birth year]  MRN:  [id number]      CHIEF COMPLAINT/HPI:  Patient is a 45 year old female who presented with left otalgia for 2 months.  Symptoms are mild-  moderate, intermittent, and unchanged.  Relevant history includes TMJ, and she is undergoing   gum procedures.  Aggravating factors include clenching the jaw, swallowing, and drinking hot   beverages.  She suspects that she has allergies and is requesting a allergy referral.  She does   not tolerate nasal irrigations (chlorine smell in the nose).  Symptoms did not significantly improve   with neck massage and anti-histamines.  Associated symptoms include occasional left scalp   sensitivity (painful to brush her hair), jaw pain, temple pain, and jaw clicking.  She denies otorrhea,   tinnitus, vertigo, and hearing loss.  The patient has no past history of otological surgery, otological   trauma, significant noise exposure, RAOM, and family history of hearing loss. PT at [employer],   teaching and sees patients.     Prior testing: none    Audiogram:     CT temporal bone:     MRI brain/internal auditory canals:        HEARING AID USE:  None      PMHX:  Past Medical History   Diagnosis Date   . Onychomycosis of toenail    . Gastritis 2000     endoscopy   . IBS (irritable bowel syndrome)      diet related   . Psoriasis      scalp-steroid   . Acne      trentinoin   . Raynaud's disease      lifelong   . MVP (mitral valve prolapse)      had chest pain and none now-echo normal   . Anaphylactic reaction      bees and wasps   . Abnormal pap      in the past and neg in 2012. No treatment   . FH: colon cancer 07/14/2013   . FH: melanoma 07/14/2013     Father and sees Derm regularly   . AR (allergic rhinitis)      uses zyrtec   . Asthma      rare and related to ar     PSHX:  Past Surgical History   Procedure Laterality Date   . Excision vaginal cyst/tumor  2012   . Gum recession surgery       FHX:   Family History      Problem Relation Age of Onset   . Colon Cancer Mother 20   . Hypertension Mother    . Parkinson's Disease Father    . Cancer Father      esophageal cancer   . Melanoma Father      SHX:  Social History     Social History   . Marital Status: N/A     Spouse Name: N/A   . Number of Children: N/A   . Years of Education: N/A     Occupational History   . Not on file.     Social History Main Topics   . Smoking status: Never Smoker    . Smokeless tobacco: Never Used   . Alcohol Use: Yes      Comment: 2 drinks per week   . Drug Use: No   . Sexual Activity:     Partners: Male  Comment: partner had a vasectomy     Other Topics Concern   . Not on file     Social History Narrative    Shemariah and her family moved here from [place] several years ago. 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 Her girls go to [employer] and [employer].       ALLERGIES:  is allergic to bee venom and wasp venom.    MEDICATIONS:  has a current medication list which includes the following prescription(s): vitamin d, doxycycline hyclate, epinephrine, pediatric multivitamins-fl, and tretinoin.        REVIEW OF SYSTEMS:    Constitutional: denies fatigue, fever, chills, sweats   Eye: negative  ENT: + otalgia, temple pain, jaw pain, TMJ, post-nasal drainage, Denies excessive cerumen, fullness in ears, infections, tinnitus, vertigo, noise exposure, Nose/Sinus: anosmia/hyposmia, epistaxis, facial pain, nasal congestion, nasal obstruction, rhinorrhea, sinusitis, sneezing, Throat: taste change, voice change, hoarseness, dysphagia, odynophagia, sore tongue, mouth sores, pharyngitis, snoring, tooth pain   Cardiovascular: negative   Respiratory:  negative  Gastrointestinal:  Negative  Endocrine:  negative    Neurological: + scalp sensitivity  Psychiatric:  negative   Integumentary: negative and no rashes nor lesions of concern   Musculoskeletal: negative   Hematologic: negative  Immunologic: ? environmental allergies       PHYSICAL EXAMINATION:    General: Well  developed, appearing stated age and in no acute distress.  Participated in the patient interview appropriately.  Eyes: Clear conjunctive, normal lids. Pupils equal, round and reactive to light.  Extraocular movements intact.  Ears: visualized by microscopy.    Right: Pinna unremarkable.  External ear canal without masses or skin abnormality. No cerumen. Tympanic membrane intact and mobile without retraction or perforation. Middle ear without fluid.  No hearing loss.     Left: Pinna unremarkable.  External ear canal without masses or skin abnormality. No cerumen. Tympanic membrane intact and mobile without retraction or perforation. Middle ear without fluid. No hearing loss.      Nose:  Nasal mucosa normal with clear mucus.  Midline septum with no polyps or lesions.  Mild turbinate hypertrophy.  OP: Clear without masses or ulcerations. Normal lips, teeth, and gums.  Normal hard palate, soft palate, and posterior pharynx. No parotid or submandibular gland tenderness, masses, or swelling.  2+ tonsils, cryptic.  + right TMJ clicking, interincisal opening 3.5 cm.  HP:  Clear without mucosal abnormalities or masses  Neck: Supple and without masses, midline trachea, normal thyroid without enlargement, tenderness, and masses.  Lymphatic: normal submandibular, cervical, and supraclavicular lymph nodes    Skin: No rashes   Neuro:  CN II-XII grossly intact (VII Movements of facial expression bilaterally intact and symmetrical)  Cardiovascular: Regular rate and rhythm, normal carotid pulses  Lungs: normal effort  Extremities: No edema      IMPRESSION/PLAN: Atypical facial pain, allergic rhinitis  AAO TMJ handout provided  Warm compresses, soft diet, mouth guard (OTC or from dentist)    Refer to allergy testing - [facility] per patient request      Kirke Shaggy, MD      Cc:   Thea Silversmith, [place], [place] [id number]  [place] [address]

## 2015-06-12 ENCOUNTER — Ambulatory Visit: Payer: No Typology Code available for payment source | Attending: Dermatology | Admitting: Dermatology

## 2015-06-12 DIAGNOSIS — D239 Other benign neoplasm of skin, unspecified: Secondary | ICD-10-CM

## 2015-06-12 DIAGNOSIS — B078 Other viral warts: Secondary | ICD-10-CM

## 2015-06-12 NOTE — Addendum Note (Signed)
Addended by: Volanda Napoleon LEE on: 06/12/2015 12:27 PM     Modules accepted: Orders, SmartSet

## 2015-06-12 NOTE — Progress Notes (Signed)
[facility] [place]  l  [place]  [address]  TEL: [phone]  l  FAX: [phone]      06/12/2015    PRIMARY CARE PROVIDER:  Thea Silversmith, [place] [id number]  [place] [address]    PATIENT: Claudia Jensen    [id number]    IDENTIFICATION/CHIEF CONCERN:    Dionte Preece is a 45 year old female return patient seen today for continued management of warts.     DERMATOLOGY PROBLEM LIST AND TREATMENTS:  Psoriasis   Lentigos   Warts   Onycholysis  FHx of melanoma    Intralesional Candida Hx:  05/15/2015 - 0.2 ml (base of right thumb)  04/12/2015 - 0.18 ml (left volar thumb)  03/22/2015 - 0.2 ml (left volar thumb)    DERMATOLOGIC HISTORY OF PRESENT ILLNESS:  Tyrena Halls was last seen on 05/15/15 at which time her wart on the right thumb was injected with candida for the third time.    Since the last visit she reports the warts on her thumbs have resolved and she did not have any problems with the previous injections. She asks if a lesion on her right inner thigh is a wart. Otherwise, no other skin complaints.     PHYSICAL EXAMINATION:  Vital Signs: There were no vitals taken for this visit.  General: She is a well appearing adult female in no acute distress.  Normal development, and nutrition.  Normal affect and mood, without barriers to understanding.  Skin: Exam performed included bilateral hands and right thigh.  Exam was remarkable for:    Left index finger, volar aspect - 56mm verrucous papule  Left middle finger, radial side - 51mm verrucous papule  2 verrucous papules on the right middle finger - largest is 35mm and smallest is 26mm  Medial right thigh - firm dermal papule that has some erythema and some tan pigmentation    IMPRESSION/PLAN:  The following was discussed with the patient.  Recommendations are as follows:    1. Other viral warts  Offered repeat candida injections today, she accepts; see procedure note  below.  - INJECTION INTRALESIONAL UP TO & INCLUD 7 LESIONS    2. Dermatofibroma  this is a benign fibrohistiocytic tumor that is commonly found in areas of trauma, the lower extremities are extremely common locations for these to occur.  They can be painful and occasionally her itchy, if they become symptomatic take any removed using a biopsy.       Intralesional candida injection    Diagnosis: warts  Site(s): left index finger and right middle finger    The risks of pain, exuberant dermatitis, bleeding, and ineffectiveness was explained to the patient. The potential benefit of improving the condition was explained to the patient. The patient agreed to the procedure after being informed of the risks and benefits. Procedure 0.2 mL of candida antigen, half was injected directly into left index finger and largest papule on the right middle finger. There were no complications and the patient tolerated the procedure well.    Return to clinic in 3-5 weeks or sooner PRN acute concerns.    I, Saintclair Halsted, Medical Scribe, acted as a Education administrator in the presence of the provider indicated below and documented the service or procedure performed. To the best of my knowledge, I recorded what was dictated by the provider, Lily Lovings, MD,  who will provide the final review and authentication.    Signed: Saintclair Halsted, Medical Scribe  Date: 06/12/2015  Time: 7:24 AM

## 2015-06-12 NOTE — Progress Notes (Signed)
I, Nicholas L Compton, MD, personally performed the services described in this documentation, as scribed by Arlene Orbino in my presence, and it is both accurate and complete.

## 2015-07-19 ENCOUNTER — Encounter (HOSPITAL_BASED_OUTPATIENT_CLINIC_OR_DEPARTMENT_OTHER): Payer: Self-pay | Admitting: Registered Nurse

## 2015-07-19 ENCOUNTER — Ambulatory Visit (HOSPITAL_BASED_OUTPATIENT_CLINIC_OR_DEPARTMENT_OTHER): Payer: No Typology Code available for payment source | Admitting: Registered Nurse

## 2015-07-19 ENCOUNTER — Ambulatory Visit: Payer: No Typology Code available for payment source | Attending: Registered Nurse | Admitting: Dermatology

## 2015-07-19 VITALS — BP 142/85 | HR 62 | Ht 69.75 in | Wt 138.0 lb

## 2015-07-19 DIAGNOSIS — Z0000 Encounter for general adult medical examination without abnormal findings: Secondary | ICD-10-CM | POA: Insufficient documentation

## 2015-07-19 DIAGNOSIS — L700 Acne vulgaris: Secondary | ICD-10-CM | POA: Insufficient documentation

## 2015-07-19 DIAGNOSIS — B078 Other viral warts: Secondary | ICD-10-CM | POA: Insufficient documentation

## 2015-07-19 HISTORY — DX: Acne vulgaris: L70.0

## 2015-07-19 MED ORDER — SPIRONOLACTONE 100 MG OR TABS
100.0000 mg | ORAL_TABLET | Freq: Every day | ORAL | 3 refills | Status: DC
Start: 2015-07-19 — End: 2015-12-14

## 2015-07-19 MED ORDER — TRETINOIN 0.05 % EX CREA
TOPICAL_CREAM | CUTANEOUS | 6 refills | Status: DC
Start: 2015-07-19 — End: 2016-07-17

## 2015-07-19 NOTE — Progress Notes (Signed)
[facility]  Preventive Health Visit    ID/CC:  Delynda Sheckler is a 45 year old female presenting for an annual preventive visit.    I reviewed the patient's current [facility] Preventive Health Visit form, and I reviewed her past medical history, family history and social history as documented in [employer], updated at this visit.     Lifestyle is healthy & low-risk in the following areas:   Diet:  YES    Exercise:  YES    Physical safety & risk of injury:  YES    Exposure to tobacco:  YES   Exposure to alcohol: YES   Use of other substances: YES       Counseling on lifestyle & health habits at this visit:  Reviewed and doing great    The following areas pose potential risk to health:   Depression:  No   Cancer risk factors:  No  Cardiovascular risk factors:  No   Potential for exposure to infection: No  Potential for unintended pregnancy:  No      Counseling on screening, prevention, & preventive management at this visit: reveiwed    Other topics discussed:     She is seeing our Derm service for skin review, acne and warts. She is otherwise doing well and her girls are 59 and 58..     Her older daughter will go to [employer] for social work.    Health Maintenance   Topic Date Due   . Influenza Vaccine (1) 09/14/2015   . Pap Smear  03/16/2017   . Colonoscopy  08/13/2018   . Tetanus Vaccine  07/15/2023   . HIV Screen  Addressed       Immunization History   Administered Date(s) Administered   . Tdap vaccine 07/14/2013   . influenza vaccine quadrivalent PF 11/15/2014       Physical exam:  Vitals: BP 142/85   Pulse 62   Ht 5' 9.75" (1.772 m)   Wt 138 lb (62.6 kg)   LMP 07/02/2015 (Exact Date)   BMI 19.94 kg/m   PHYSICAL EXAM:  General: healthy, alert, no distress  Skin: Skin color, texture, turgor normal. No rashes or concerning lesions  Head: Normocephalic. No masses, lesions, tenderness or abnormalities  Neck: supple. No adenopathy. Thyroid symmetric, normal size, without nodules  Lungs: clear to  auscultation  Heart: normal rate, regular rhythm and no murmurs, clicks, or gallops  Abd: soft, non-tender. BS normal. No masses or organomegaly  Breasts:  Inspection:  Normal breast symmetry,  normal nipples without discharge.No skin lesions.  Palpation: Normal breast tissue bilaterally, no tenderness, no suspicious masses.  Axillary nodes: No adenopathy.  Pelvic:  deferred.     Assessment & Plans:  45 year old female for annual preventive visit.    Preventive counseling at this visit (in addition to comments above): see avs     Routine medication refills were done as part of this visit: No    Diagnoses & orders for this visit:     (Z00.00) Routine general medical examination at a health care facility  (primary encounter diagnosis)    Overall doing well, and up to date with HCM issues as of today. Excellent self-care and motivation to continue with her healthy lifestyle practices.        Follow up in 1 year(s) or sooner with additional concerns.      Stefano Gaul, Marianna, ARNP  [facility]

## 2015-07-19 NOTE — Progress Notes (Deleted)
[facility] at [place]  l  [place]  [address]  TEL: [phone]  l  FAX: [phone]      07/19/2015    PRIMARY CARE PROVIDER:  Thea Silversmith, [place] [id number]  [place] [address]    PATIENT: Claudia Jensen    [id number]    IDENTIFICATION/CHIEF CONCERN:    Claudia Jensen is a 45 year old female return patient seen today for repeat candida injection for warts.     DERMATOLOGY PROBLEM LIST AND TREATMENTS:  Psoriasis   Lentigines   Warts   Onycholysis  FHx of melanoma (father)  Acne vulgaris    Intralesional Candida Hx:  07/19/2015 - 0.2 ml (R middle finger)  05/15/2015 - 0.2 ml (base of R thumb)  04/12/2015 - 0.18 ml (L volar thumb)  03/22/2015 - 0.2 ml (L volar thumb)    HISTORY OF PRESENT ILLNESS:  Claudia Jensen was last seen 5/30/017 during which she had her 3rd injection of candida for warts.      Since the last visit the one on the left index finger, at the site of the latest inject, has gotten smaller.  She feels that they have all gotten smaller, but unfortunately have not resolved them except for those on the left thumb.  It seems to mostly effect only the lesion that is injected.    She also has concern of acne.  She is using tretinoin 0.025%.  It does not seems to be effective as it once was.  Her skin is never dry.  She used to treat with OCP, but is unable to take OCP now, it increases her blood pressure.  She is on doxycycline 10-20 mg for her gums.  She had a recent ovarian cyst that burst.  She denies hormonal flares.      SOCIAL HISTORY:  Recently vacationed in [place]. Teaches physical therapy assistants. Grew up in [place]. She reports that she has never smoked. She has never used smokeless tobacco. She reports that she drinks alcohol. She reports that she does not use drugs.    FAMILY HISTORY:  Father with melanoma     PHYSICAL EXAMINATION:  Vital Signs: Last menstrual period 07/02/2015.  General:  She is a well appearing adult female in no acute distress.  Normal development, and nutrition.  Normal affect and mood, without barriers to understanding.  Skin: Exam performed included scalp, hair, face, neck, hands, fingernails.  Exam was remarkable for:    Predominantly chin and jaw line - inflammatory papules, some crusting  Few inflammatory papules - R zygomatic arch, R malar cheek, and forehead   Open comedones - nose    Verrucous papules:   L index, volar - 1 mm   L middle, volar - 2 mm  R middle, volar x 2 - 2 mm and 3.5 mm    IMPRESSION/PLAN:  The following was discussed with the patient.  Recommendations are as follows:    1. Other viral warts  (R middle finger - injected today)   she seems to be getting some benefit from the intralesional Candida in the warts that we are treating unfortunately the other warts are not improving along with them.  Because she is getting some improvement, we decided to repeat injection today  -  Repeat candida injection offered today, patient accepts.    2. Acne vulgaris  Acne Moderate:  Inflammatory.  Though she does not have a perimenstrual flare, the appearance and distribution of the acne is most consistent with hormonally induced acne.  Firstly she cannot take oral contraceptives due to high blood pressure so we will try spironolactone.  We discussed breast tenderness and vaginal spotting as potential side effects.  -  Tretinoin:  Increase from 0.025% to 0.05%, ordered today.  -  Recommend spironolactone 100 mg daily, ordered today.  Do not get pregnant while taking as it is an androgen blocker.    Return to clinic after returning from vacation in August or sooner PRN acute concerns.    Intralesional Candida Injection    Diagnosis: Warts  Site(s): R middle finger    The risks of pain, exuberant dermatitis, bleeding, and ineffectiveness was explained to the patient. The potential benefit of improving the condition was explained to the patient. The patient agreed to the  procedure after being informed of the risks and benefits. Procedure 0.2 mL of candida antigen was injected directly into the affected area. There were no complications and the patient tolerated the procedure well.    I, Kris Hartmann, Medical Scribe, Presenter, broadcasting, acted as a Education administrator and documented the service/procedure performed to the best of my knowledge in the presence of Lily Lovings, MD who will provide the final review and authentication.    Date: 07/19/2015  Time: 8:28 AM

## 2015-07-19 NOTE — Progress Notes (Signed)
[facility] at [place]  l  [place]  [address]  TEL: [phone]  l  FAX: [phone]      07/19/2015    PRIMARY CARE PROVIDER:  Thea Silversmith, [place] [id number]  [place] [address]    PATIENT: Claudia Jensen    [id number]    IDENTIFICATION/CHIEF CONCERN:    Claudia Jensen is a 45 year old female return patient seen today for repeat candida injection for warts.     DERMATOLOGY PROBLEM LIST AND TREATMENTS:  Psoriasis   Lentigines   Warts   Onycholysis  FHx of melanoma (father)  Acne vulgaris    Intralesional Candida Hx:  07/19/2015 - 0.2 ml (R middle finger)  05/15/2015 - 0.2 ml (base of R thumb)  04/12/2015 - 0.18 ml (L volar thumb)  03/22/2015 - 0.2 ml (L volar thumb)    HISTORY OF PRESENT ILLNESS:  Claudia Jensen was last seen 5/30/017 during which she had her 3rd injection of candida for warts.      Since the last visit the one on the left index finger, at the site of the latest inject, has gotten smaller.  She feels that they have all gotten smaller, but unfortunately have not resolved them.  It seems to mostly effect only the lesion that is injected.    She also has concern of acne.  She is using tretinoin 0.025%.  It does not seems to be effective as it has been in the past.  Her skin is never dry.  She used to treat with OCP, but is unable to take OCP now, it increases her blood pressure.  She is on doxycycline 10-20 mg for her gums.  She had a recent ovarian cyst that burst.  She denies hormonal flares.      SOCIAL HISTORY:  Recently vacationed in [place]. Teaches physical therapy assistants. Grew up in [place]. She reports that she has never smoked. She has never used smokeless tobacco. She reports that she drinks alcohol. She reports that she does not use drugs.    FAMILY HISTORY:  Father with melanoma     PHYSICAL EXAMINATION:  Vital Signs: Last menstrual period 07/02/2015.  General: She is a well appearing  adult female in no acute distress.  Normal development, and nutrition.  Normal affect and mood, without barriers to understanding.  Skin: Exam performed included scalp, hair, face, neck, hands, fingernails.  Exam was remarkable for:    Predominantly chin and jaw line - inflammatory papules, some crusting  Few inflammatory papules - R zygomatic arch, R malar cheek, and forehead   Open comedones - nose    Verrucous papules:   L index, volar - 1 mm   L middle, volar - 2 mm  R middle, volar x 2 - 2 mm and 3.5 mm    IMPRESSION/PLAN:  The following was discussed with the patient.  Recommendations are as follows:    1. Other viral warts  (R middle finger - injected today)  She has noted improvement with the warts that we are injecting and infectious has had resolution of the wart on her left thumb unfortunately the other warts that are not being actively treated are not improving.  We briefly discussed changing to cryotherapy today but chose to move forward with candida injections.  -  Repeat candida injection offered today, patient accepts.    2. Acne  vulgaris  Acne Moderate inflammatory: Her acne, based on distribution and appearance, is most consistent with hormonally induced acne.  She does not note perimenstrual flares however.  Typically this is well treated with spironolactone.  Today we discussed breast tenderness and potential for vaginal spotting as side effects.  Unfortunately she cannot take oral contraceptives due to blood pressure issues which can typically be used to decrease or resolve the vaginal spotting.  This may not happen at 100 mg daily as well as start.  Also discussed increasing her tretinoin today.  -  Tretinoin:  Increase from 0.025% to 0.05%, ordered today.  -  Recommend spironolactone 100 mg daily, ordered today.  Do not get pregnant while taking as it is an androgen blocker.      Return to clinic after returning from vacation in August or sooner PRN acute concerns.    Intralesional Candida  Injection    Diagnosis: Warts  Site(s): R middle finger    The risks of pain, exuberant dermatitis, bleeding, and ineffectiveness was explained to the patient. The potential benefit of improving the condition was explained to the patient. The patient agreed to the procedure after being informed of the risks and benefits. Procedure 0.2 mL of candida antigen was injected directly into the affected area. There were no complications and the patient tolerated the procedure well.    I, Kris Hartmann, Medical Scribe, Presenter, broadcasting, acted as a Education administrator and documented the service/procedure performed to the best of my knowledge in the presence of Lily Lovings, MD who will provide the final review and authentication.    Date: 07/19/2015  Time: 8:28 AM

## 2015-07-19 NOTE — Patient Instructions (Addendum)
Your last Tdap was in July of 2015    Leading a Healthy Life-Six tips to help improve your health and wellness     . Eat well to give your body the energy it needs.   . Stay or get active.   . A healthy mind is part of a healthy body.   . Practice safe living habits.   Marland Kitchen Keep your mind and body free of harmful drugs and alcohol.   . Get regular health care.     Tip #1: Eat well to give your body the energy it needs   Your body needs nutritious foods to stay strong and healthy. Here are some general eating guidelines:   Eat real food (not processed, packaged, and already made)   Eat adequate vegetables and fruits (6 servings daily). These foods contain invaluable micronutrients, phytochemicals, antioxidants and fiber.   Choose whole grains over simple carbs. (brown rice over white rice)   Eat more fiber (legumes, fresh fruit, ground flax seeds) Minimum daily fiber intake should be 30 grams.   Eat healthy fats (olive oil, nuts, avocados, essential fatty acids) and avoid trans fats and high saturated fats.   Eat adequate protein (0.5 g/lb body weight daily) from lean sources including: poultry, eggs, legumes, tofu, dairy products, nuts and seeds.   Eat calcium rich foods (milk, cheese, yogurt, kale legumes, tofu, figs, sardines), three to four servings daily. Calcium is best absorbed through food.   Eliminate the intake of refined sugar and refined carbohydrates.   Eat healthy snacks with protein source.   Keep salt intake low. Less than 1500 mg daily.   Drink adequate water-generally 64 ounces daily.   Avoid sugary drinks (juice and soda)   Limit caffeine to no more than three to four cups a day    Tip #2: Stay or get active     It's recommended that for best health, people get 150 minutes of exercise per week.  This can include walking.  That amount breaks down to:   22 minutes, every day, or   30 minutes on 5 days/week, or   50 minutes, 3 times a week, or                  1 hr 15 min, twice a week  Regular physical  activity can help you:   . Live longer and feel better   . Be stronger and more flexible   . Build strong bones   . Prevent depression and anxiety   . Strengthen your immune system   . Maintain a healthy body weight   . Improve brain function and generate new brain cells  . There is growing evidence that people that sit for prolonged periods have greater health risks. If you have a desk job, try to get up at least two to three times an hour, or as much as possible throughout the day.     Tip #3: Remember: A healthy mind is part of a healthy body    A good state of mind can help you make healthy choices. Here are a few tips for keeping your mind healthy:   . Reduce stress in your life!  Stress is one of the main causes of illness.  . Make some time every day for things that are fun and/or pleasant   . Get enough sleep. Lack of sleep reduces how well you can concentrate, increases mood swings, and raises your risks of accidents  . Consider  mindfulness training/meditation-10 minutes a day is all you need to meditate  . Ask your health care provider for help if you feel depressed or anxious for more than two weeks in a row.     Tip #4: Practice safe living habits     Accidents and Injuries   . Accidents and injuries are the 5th leading cause of death in the [place]   . Women under age 71 are more likely to die in motor vehicle accidents than from any other cause.   . Accidents in the home cause thousands of permanent injuries every year. The most common accidents are fires, falls, and drowning. To help yourself and your family stay safe:   Marland Kitchen Install smoke detectors on each floor of your home.   . Make sure everyone in your family knows how to swim  . Stay safe on the road:  o Wear a seatbelt.   o Do not ride with someone who has been drinking or taking drugs.   o Do not speak on a cell phone or send, read, or write text messages while you are driving.   o Wear a helmet when you ride a bicycle or motorcycle.   o Get enough  sleep at night, and do not drive when you are tired.     Hand Hygiene   Protect yourself from germs by washing your hands often. Always wash your hands:   . After you change a diaper or use the toilet   . Before you start and after you finish preparing food     Tip #5: Keep your mind and body free of harmful drugs and alcohol      Tobacco causes more health problems than any other substance. These problems include lung disease, heart disease, and many types of cancer. The nicotine in tobacco is the most addictive and widely used drug.    Too much alcohol can cause damage to your liver, heart, brain, bones, and other body tissues. Being under the influence of alcohol also increases your chance of being injured in an accident. Alcohol can cause fetal alcohol syndrome in your children if you drink regularly when you are pregnant.    Street drugs, like marijuana, cocaine, methamphetamine, heroin, or pain pills not prescribed by your doctor can harm your health. They may be mixed with harmful substances, and using them can cause people to put themselves in dangerous situations.     Tip #6: Get regular health care     Many people think they need to see their provider only when they are sick. But, health care providers can also help you stay healthy.   . Find a health care provider who works with you to manage your health.   . Ask your health care provider what diseases you are at risk for. Learn what you can do to prevent or control them.    Get yourself and your family immunized against life-threatening diseases.   Don't save your concerns for your annual check-up. Come in as they occur.  Please be sure to get vitamin D and calcium in order to protect bones.      1)  Vitamin D can be taken once a day and, when present in the body, helps you absorb calcium and get it into your bones.      The amount currently recommended is 800 - 2000 IU/day.      It's best to get Vitamin D from daily supplement, as it's hard to get  from diet.      It's in fortified milk at 100 IU/cup but not in most brands of yogurt.  Other sources are: egg yolks, liver, mushrooms, and fatty fish (like salmon).      It's "the sunshine vitamin", but in the [place] it's hard to get enough sun - and enough light of the right wavelengths - for [place] to make the amount of Vitamin D that we need.    2)  Calcium is crucial because your body needs to have a steady blood level of calcium for your brain, nerves, muscles, and heart to function normally, and we all lose some calcium throughout the day in our urine, so if there's not enough calcium coming in from your diet, it will be taken from your bones.     The amount recommended is 1200 mg/day.  If possible, it's better to get it from diet than from supplements.    Whether from diet or calcium supplements, calcium should be spread out over 2-3 meals.  More than 600 mg should not be taken all at once, because there's a limit to how much can be absorbed at one time.  Calcium supplements should not be taken on an empty stomach, because that increases the risk of kidney stones.    Dietary sources are better absorbed than supplements.  Foods high in calcium reduce rather than raise the risk of kidney stones, and they may reduce the risk of heart disease in the long run:    - Milk contains 300 mg of calcium per cup.  - Yogurt contains 400 mg of calcium per cup.  - Calcium-fortified foods vary in their calcium content. Check the label & reduce by half the number listed as "% of daily requirement", meaning that a food labeled to say that 1 serving provides "20% of daily requirement", actually provides 10% of your goal of 1200 mg.    Collard greens, cooked 1 cup 357   Soy, rice, and  almond milk (calcium fortified) 8 ounces 150-300   Tofu, processed with calcium sulfate* 4 ounces 200-420   Calcium-fortified orange juice 8 ounces 350   Soy yogurt, plain 6 ounces 300   Spinach, cooked 1 cup 250   Tofu, processed with nigari* 4  ounces 130-400   Tempeh 1 cup 184   Kale, cooked 1 cup 179   Soybeans, cooked 1 cup 175   Bok choy, cooked 1 cup 158   Mustard greens, cooked 1 cup 152   Chick peas (canned) 1/2 cup 40   Tahini 2 Tbsp 128   Navy beans, cooked 1 cup 126   Almond butter 2 Tbsp 111   Almonds, whole 1/4 cup 94   Broccoli, cooked 1 cup 62   Dried figs 1/2 cup 150   Instant oatmeal  1 package 100-150   Blackstrap Molasses 1 Tbs 140     Calcium supplements are useful for people who find their diet doesn't provide the full recommended amount.  In that case, add a calcium supplement 315 - 600 mg, 2-3 times a day with meals to provide up to 1200 mg total per day, as either of the following:    a) Calcium citrate (best absorbed) - for example, Citracal 315 mg, 1-2 twice daily with 1-2 meals, or    b) Calcium carbonate (absorbed well, comes in more forms than calcium citrate, including chewable forms) - for example, Tums 500 or Viactiv, 1 twice daily

## 2015-07-19 NOTE — Progress Notes (Signed)
I, Nicholas L Compton, MD, personally performed the services described in this documentation, as scribed by Jessie Chin in my presence, and it is both accurate and complete.

## 2015-08-21 NOTE — Progress Notes (Signed)
[facility] [place]  [address]  TEL: [phone]  l  FAX: [phone]      08/28/2015    PRIMARY CARE PROVIDER:  Thea Silversmith, [id number]  [place] [address]    PATIENT: Claudia Jensen    [id number]    IDENTIFICATION/CHIEF CONCERN:    Claudia Jensen is a 45 year old female return patient seen today for repeat candida injection for warts.     DERMATOLOGY PROBLEM LIST:  Psoriasis   Lentigines   Warts   Onycholysis  FHx of melanoma (father)  Acne vulgaris    DERMATOLOGY MEDS AND TREATMENTS:   Spironolactone 100 mg daily  Tretinoin 0.05%    INTRALESIONAL CANDIDA Hx:  08/28/2015 - 0.2 ml per site (R knee, volar L 3rd finger)  07/19/2015 - 0.2 ml (R middle finger)  05/15/2015 - 0.2 ml (base of R thumb)  04/12/2015 - 0.18 ml (L volar thumb)  03/22/2015 - 0.2 ml (L volar thumb)     HISTORY OF PRESENT ILLNESS:  Claudia Jensen was last seen 07/19/2015 during which she had intralesional candida 0.2 ml on her R middle finger.  -  Acne vulgaris:  Spironolactone 100 mg daily and tretinoin 0.05% qhs.    Today is a procedure visit for previously discussed treatment plan.  The last candida injection was well-tolerated.  She now only has one right knee and left middle finger.  She denies any new warts.      SOCIAL HISTORY:  Teaches physical therapy assistants. Has 2 children ages 75 and 36.  Grew up in [place].  Never smoker.  Drinks alcohol.  No drug use.    FAMILY HISTORY:  Father with melanoma    PHYSICAL EXAMINATION:  Vital Signs: There were no vitals taken for this visit.  General: She is a well appearing adult female in no acute distress.  Normal development, and nutrition.  Normal affect and mood, without barriers to understanding.  Skin: Exam performed included left hand and right leg.  Exam was remarkable for:    Radial side of volar surface of L middle finger - 2 mm verrucous papule  Anterior aspect R knee  - 3 mm verrucous papule    IMPRESSION/PLAN:  The following was discussed with the patient.  Recommendations are as follows:    1. Other viral warts  (2: R knee and L 3rd finger - injected with candida)  She has had some good success with resolution of the verruca with each one that we inject but has not had a profound effect with the other warts that we haven't injected.  Likely she only has 2 left.  Hopefully this will be her last injection.  -  Intralesional candida offered today, patient accepts.  -  Discussed OTC and home treatments for recurrence.  AVS included today.      Return to clinic prn.    Intralesional Candida Injection    Diagnosis: Warts  Site(s): R knee and L 3rd finger    The risks of pain, exuberant dermatitis, bleeding, and ineffectiveness was explained to the patient. The potential benefit of improving the condition was explained to the patient. The patient agreed to the procedure after being informed of the risks and benefits. Procedure 0.2 mL per site of candida antigen was injected directly into the affected area. There were no complications and  the patient tolerated the procedure well.    August 28, 2015. 10:21 AM. Kris Hartmann, Medical Scribe, acting as scribe for Lily Lovings, MD who will provide the final review and authentication.

## 2015-08-28 ENCOUNTER — Ambulatory Visit: Payer: No Typology Code available for payment source | Attending: Dermatology | Admitting: Dermatology

## 2015-08-28 DIAGNOSIS — B078 Other viral warts: Secondary | ICD-10-CM | POA: Insufficient documentation

## 2015-08-28 NOTE — Progress Notes (Signed)
If our office needs to contact you after your visit today, is it ok to leave a detailed message on your phone or e-care ?  YES      What is the preferred method of contact e-care/ phone  number? [phone]

## 2015-08-28 NOTE — Patient Instructions (Signed)
Treating Warts at Home.    If you had a wart treated with freezing, it will likely form a blister that will fall off in 1-2 weeks.  Once it falls off, you can treat what remains as described below.    At the drugstore, go to the wart section and buy some Salicylic Acid Tape, Plaster, or Liquid (Compound W or Dr. Scholl's are common brands)  . Apply the salicylic acid tape, plaster, or liquid over the wart and a thin border of normal, unaffected skin.  . Cover the wart and the treatment product with duct tape or a bandaid. Leave it covered for about 24 hours, unless it becomes uncomfortable.  . Remove the duct tape just before bathing.  . After bathing, gently rub the dead, white skin off with the pumice stone that you don't use for anything else or you may spread warts to new areas.  . When the area is dry, apply a fresh treatment.  . Repeat EVERY DAY for several months.        The day before you are scheduled to return to the Dermatology Center, leave the area open to the air with no product or tape on it. The wart should be frozen every 3 to 4 weeks. This combination of home and clinic treatments usually gets rid of the wart.

## 2015-08-28 NOTE — Progress Notes (Signed)
I, Nicholas L Compton, MD, personally performed the services described in this documentation, as scribed by Jessie Chin in my presence, and it is both accurate and complete.

## 2015-09-25 ENCOUNTER — Encounter (HOSPITAL_BASED_OUTPATIENT_CLINIC_OR_DEPARTMENT_OTHER): Payer: No Typology Code available for payment source | Admitting: Dermatology

## 2015-11-14 ENCOUNTER — Other Ambulatory Visit: Payer: Self-pay

## 2015-12-14 ENCOUNTER — Telehealth (HOSPITAL_BASED_OUTPATIENT_CLINIC_OR_DEPARTMENT_OTHER): Payer: Self-pay | Admitting: Registered Nurse

## 2015-12-14 ENCOUNTER — Ambulatory Visit (INDEPENDENT_AMBULATORY_CARE_PROVIDER_SITE_OTHER): Payer: No Typology Code available for payment source | Admitting: Family Medicine

## 2015-12-14 ENCOUNTER — Encounter (INDEPENDENT_AMBULATORY_CARE_PROVIDER_SITE_OTHER): Payer: Self-pay | Admitting: Family Medicine

## 2015-12-14 VITALS — BP 132/87 | HR 57 | Temp 97.5°F | Resp 16 | Wt 140.0 lb

## 2015-12-14 DIAGNOSIS — R1030 Lower abdominal pain, unspecified: Secondary | ICD-10-CM

## 2015-12-14 DIAGNOSIS — R110 Nausea: Secondary | ICD-10-CM

## 2015-12-14 DIAGNOSIS — Z6820 Body mass index (BMI) 20.0-20.9, adult: Secondary | ICD-10-CM

## 2015-12-14 LAB — PR U/A AUTO DIPSTICK ONLY, ONSITE
Bilirubin, Urine: NEGATIVE
Glucose, Urine: NEGATIVE mg/dL
Ketones, URN: NEGATIVE mg/dL
Leukocytes: NEGATIVE
Nitrite, URN: NEGATIVE
Occult Blood, URN: NEGATIVE
Protein: NEGATIVE mg/dL
Specific Gravity, Urine: 1.015 (ref 1.005–1.030)
Urobilinogen, URN: 0.2 E.U./dL (ref 0.2–1.0)
pH, URN: 7.5 (ref 5.0–8.0)

## 2015-12-14 LAB — PR URINE PREGNANCY TEST HCG, ONSITE: Pregnancy (HCG) (UWNC), URN: NEGATIVE

## 2015-12-14 NOTE — Telephone Encounter (Signed)
(  TEXTING IS AN OPTION FOR [facility] CLINICS ONLY)  Is this a [place] clinic? No      RETURN CALL: Detailed message on voicemail only      SUBJECT:  Appointment Request     REASON FOR REQUEST/SYMPTOMS: Nausea  REFERRING PROVIDER: na  REQUEST APPOINTMENT WITH: Isaiah Blakes or any other provider  REQUESTED DATE: asap, TIME: asap  UNABLE TO APPOINT BECAUSE:Nausea not on PST. Please call thanks

## 2015-12-14 NOTE — Progress Notes (Signed)
Patient Referred By: No ref. provider found  Patient's PCP: Thea Silversmith, ARNP     Subjective:  Patient is a 45 year old female, here to discuss Nausea (Gums surgery 1 month ago and got antibiotics for 10 days aprox.. Nauseated x 1 week after abdominal pain ( belly contraction x 2 hours ). Tried tums, peptobismol and probiotics. Currently ocasional discomfort in abdominal region . BM once a day urge to to to BR but no success. Pt feels with flatulence. No constipation or diarrhea. NO fever. No urinary symptoms.  No vomiting) and Menstrual Problem (x 10 days. Currently on 1oth days of mestrual cycle, spotting. )    The following portions of the patient's history were reviewed with the patient and updated as appropriate: past surgical history, past social history and past family history.    HPI 45 year old female comes in with nausea since last Sunday, had Saturday had severe pain for 2 weeks, abdomen was hard, felt like a contraction, slept off/on; vegetarian - did not eat anything unusual on Friday (some leftover Thanksgiving food).  Now abdomen is soft, has BM daily.  Had gum surgery 11/1 and was on amoxicillin; prior to gum surgery was on doxcycline for acne and gum infection.  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 No distress.   HENT:   Head: Normocephalic and atraumatic.   Right Ear: External ear normal.   Left Ear: External ear normal.   Mouth/Throat: Oropharynx is clear and moist. No oropharyngeal exudate.   Eyes: Conjunctivae and EOM are normal. Right eye exhibits no discharge. Left eye exhibits no discharge. No scleral icterus.   Neck: Normal range of motion. Neck supple. No thyromegaly present.   Cardiovascular: Normal rate, regular rhythm and normal heart sounds.    Pulmonary/Chest: Effort normal and breath sounds normal. No respiratory distress. She has no wheezes. She has no rales.   Abdominal: Soft. Bowel sounds are normal. She exhibits no distension and no mass. There is no tenderness. There is no rebound and no guarding. No hernia.   Lymphadenopathy:     She has no cervical adenopathy.   Neurological: She is alert.   Skin: Skin is warm. She is not diaphoretic.   Nursing note and vitals reviewed.    Results for orders placed or performed in visit on 12/14/15   U/A AUTO DIPSTICK ONLY, ONSITE   Result Value Ref Range    Color, Urine YELLOW     Clarity, URN CLEAR     Glucose, Urine NEG NEG mg/dL    Bilirubin, Urine NEG NEG    Ketones, URN NEG NEG mg/dL    Specific Gravity, Urine 1.015 1.005 - 1.030    Occult Blood, URN NEG NEG  pH, URN 7.5 5.0 - 8.0    Protein NEG NEG-TRACE mg/dL    Urobilinogen, URN 0.2 0.2 - 1.0 E.U./dL    Nitrite, URN NEG NEG    Leukocytes NEG NEG   URINE PREGNANCY TEST HCG, ONSITE   Result Value Ref Range    Pregnancy (HCG) ([facility]), URN Negative     INTERNAL CONTROL Control Verified         Assessment and Plan:   Diagnoses and all orders for this visit:    Lower abdominal pain  -     U/A AUTO DIPSTICK ONLY, ONSITE  -     URINE PREGNANCY TEST HCG, ONSITE    Nausea in adult    abdominal pain resolved;  Nausea - etiology uncertain, patient feels like prev gastritis, trial of zantac twice a day for 10-14 days

## 2015-12-14 NOTE — Patient Instructions (Signed)
Thank you for choosing the Urgent Care Clinic at [facility] for your visit today.  Your Provider today was Dr Boston Service    If you have lab tests that have not been completed at the time of your discharge, you will be contacted when they're completed.  This may be done either via eCare or via the phone.  If you had x-rays today you were given a preliminary diagnosis from the provider.  If something of significance difference is seen by the radiologist you will be contacted and those differences will be described or explained.  If you sign up with eCare you will be able to view the x-ray reports and the lab work within a few days of their completion.    If you have questions about your care you can call the [facility] clinic during office hours at [phone], or contact the 24 hour number at [phone].      Please follow-up with your primary care provider in 3-5 days if you are not improving or getting worst. If you are in need of a primary care provider and would like to establish care with [facility], please call [phone].    Your specific instructions are listed below:        Continue probiotics, yogurt    Try over the counter zantac or ranitidine twice a day for 10-14 days ( would help with gastritis)

## 2015-12-14 NOTE — Telephone Encounter (Signed)
Claudia Jensen reports that she had abdominal pain and nausea 6 days ago. Her belly was distended and hard when symptoms started. Her belly is soft now. Pain in mild. She reports constant nausea and she feels like she needs to urinate all the time. She is on day 10 of her cycle. This is abnormal for her to have a 10 day cycle.  She denies fever, dysuria, hematuria, back pain, vomiting and fever.  [name] will got to [facility] today.

## 2015-12-14 NOTE — Telephone Encounter (Signed)
Could not find an appt, Pt has been having a nausea for about a weeks and she can not shake it. On Saturday she had the most excruciating pain in her abdomen. For about 2 hours her belly was rock hard and really bloated since then she has not been feeling good and has a lot gas.     Could not find an appt for Pt--Okay to leave detailed message on Pts phone if she is unavailable.

## 2016-02-26 ENCOUNTER — Ambulatory Visit: Payer: No Typology Code available for payment source | Attending: Registered Nurse | Admitting: Registered Nurse

## 2016-02-26 VITALS — BP 131/79 | HR 60 | Temp 98.7°F | Wt 140.6 lb

## 2016-02-26 DIAGNOSIS — R143 Flatulence: Secondary | ICD-10-CM | POA: Insufficient documentation

## 2016-02-26 DIAGNOSIS — R1032 Left lower quadrant pain: Secondary | ICD-10-CM | POA: Insufficient documentation

## 2016-02-26 DIAGNOSIS — Z6820 Body mass index (BMI) 20.0-20.9, adult: Secondary | ICD-10-CM

## 2016-02-26 DIAGNOSIS — R142 Eructation: Secondary | ICD-10-CM | POA: Insufficient documentation

## 2016-02-26 DIAGNOSIS — R141 Gas pain: Secondary | ICD-10-CM | POA: Insufficient documentation

## 2016-02-26 LAB — CBC, DIFF
% Basophils: 2 %
% Eosinophils: 1 %
% Immature Granulocytes: 0 %
% Lymphocytes: 25 %
% Monocytes: 3 %
% Neutrophils: 69 %
% Nucleated RBC: 0 %
Absolute Eosinophil Count: 0.04 10*3/uL (ref 0.00–0.50)
Absolute Lymphocyte Count: 1.04 10*3/uL (ref 1.00–4.80)
Basophils: 0.08 10*3/uL (ref 0.00–0.20)
Hematocrit: 38 % (ref 36–45)
Hemoglobin: 12.8 g/dL (ref 11.5–15.5)
Immature Granulocytes: 0 10*3/uL (ref 0.00–0.05)
MCH: 31.2 pg (ref 27.3–33.6)
MCHC: 33.4 g/dL (ref 32.2–36.5)
MCV: 93 fL (ref 81–98)
Monocytes: 0.13 10*3/uL (ref 0.00–0.80)
Neutrophils: 2.88 10*3/uL (ref 1.80–7.00)
Nucleated RBC: 0 10*3/uL
Platelet Count: 187 10*3/uL (ref 150–400)
RBC: 4.1 10*6/uL (ref 3.80–5.00)
RDW-CV: 12.3 % (ref 11.6–14.4)
WBC: 4.17 10*3/uL — ABNORMAL LOW (ref 4.3–10.0)

## 2016-02-26 NOTE — Patient Instructions (Signed)
Eliminate gluten for at least one month and see if this makes an impact.    I will let you know when I see results

## 2016-02-26 NOTE — Progress Notes (Signed)
[facility] Note      Chief Complaint   Patient presents with   . Abdominal Pain       Issues Discussed/HPI:    1. Claudia Jensen is in with complaints of left sided sharp pain in the mid to low abdomen. She has noted more bloating and and distension in the morning with dramatic increase in abdominal girth. No appetite changes. She has normal BMs on a daily basis and feels like IBS in control. Has been eating more carbs lately as she is training for a 100 K walk in [place] in June. Typically does not eat many carbs. Has has allergy testing with blood sampling, and neg to typical allergens. No Celiac antibody testing done. Pain and bloating are intermittent and she had a neg pelvic US less than a year ago. Denies urinary sx, fever, anorexia, vomiting, change in the color of her stool, dyspareunia, abnormal vaginal discharge.  Last colon in 2015 was entirely clear without diverticula noted and neg for polyps.     Patient Active Problem List   Diagnosis   . Anaphylactic reaction   . FH: colon cancer   . MVP (mitral valve prolapse)   . FH: melanoma   . AR (allergic rhinitis)   . Asthma   . Raynaud's disease   . Acne vulgaris       Current Outpatient Prescriptions   Medication Sig Dispense Refill   . Cholecalciferol (VITAMIN D) 2000 UNITS Oral Cap Take 2,000 Units by mouth daily.     . Doxycycline Hyclate 100 MG Oral Tab Take 20 mg by mouth every 12 hours.      Marland Kitchen EPINEPHrine 0.3 MG/0.3ML Injection Solution Auto-injector Inject 0.3 mL (0.3 mg) intramuscularly one time as needed for anaphylaxis. Inject into the thigh as instructed per patient package insert. 1 each 0   . Pediatric Multivitamins-Fl (MULTI VIT/FL OR)      . Tretinoin 0.05 % External Cream Apply sparingly at bedtime to all areas prone to acne.  May cause irritation then tolerance develops. (Patient not taking: Reported on 02/26/2016) 45 g 6     No current facility-administered medications for this visit.        Review of patient's allergies  indicates:  Allergies   Allergen Reactions   . Bee Venom Anaphylaxis   . Wasp Venom Anaphylaxis       Physical Exam:  BP 131/79   Pulse 60   Temp 98.7 F (37.1 C) (Temporal)   Wt 140 lb 9.6 oz (63.8 kg)   BMI 20.32 kg/m   Constitutional: Appearance: Well developed, appearing stated age and in no acute distress  Abdomen:  Abdominal exam:  normal in appearance with normal bowel sounds and no masses or tenderness.  Liver and spleen:  no hepatosplenomegaly    Lab review: orders written for new lab studies as appropriate; see orders.     Assessment and Plan:  Claudia Jensen was seen today for abdominal pain.    Diagnoses and all orders for this visit:    Flatulence, eructation, and gas pain/LLQ pain and mid quadrant pain  -     CELIAC SEROLOGY REFLEX PANEL  -     US ABDOMEN COMPLETE  -     CBC, DIFF      Patient Ed:   Patient Instructions   Eliminate gluten for at least one month and see if this makes an impact.    I will let you know when I see results  Follow-up:  As above    [place], [place]  Margaret  [address] [place] [address]

## 2016-02-27 LAB — CELIAC SEROLOGY REFLEX PANEL
Anti Deaminated Gliadin, IgG: <1 U (ref 0–13)
Anti tTransglutaminase, IgA: <1 U (ref 0–13)

## 2016-03-11 ENCOUNTER — Ambulatory Visit: Payer: No Typology Code available for payment source | Attending: Registered Nurse

## 2016-03-11 DIAGNOSIS — R1032 Left lower quadrant pain: Secondary | ICD-10-CM | POA: Insufficient documentation

## 2016-03-20 ENCOUNTER — Ambulatory Visit: Payer: No Typology Code available for payment source | Attending: Registered Nurse | Admitting: Registered Nurse

## 2016-03-20 ENCOUNTER — Encounter (HOSPITAL_BASED_OUTPATIENT_CLINIC_OR_DEPARTMENT_OTHER): Payer: Self-pay | Admitting: Registered Nurse

## 2016-03-20 VITALS — BP 121/74 | HR 60 | Wt 143.4 lb

## 2016-03-20 DIAGNOSIS — K760 Fatty (change of) liver, not elsewhere classified: Secondary | ICD-10-CM

## 2016-03-20 DIAGNOSIS — Z6820 Body mass index (BMI) 20.0-20.9, adult: Secondary | ICD-10-CM

## 2016-03-20 LAB — ALPHA-1 ANTITRYPSIN: Alpha 1 Antitrypsin: 129 mg/dL (ref 84–218)

## 2016-03-20 LAB — HEPATITIS B SURFACE AG & AB
Hepatitis B Surf Antibody Intl Units: 299.74 m[IU]/mL
Hepatitis B Surface Ab: REACTIVE
Hepatitis B Surface Antigen w/Reflex: NONREACTIVE

## 2016-03-20 LAB — FERRITIN: Ferritin: 9 ng/mL — ABNORMAL LOW (ref 10–180)

## 2016-03-20 LAB — IRON BINDING CAPACITY (W/IRON, TRANSFERRIN & TRANSF SAT)
Iron, SRM: 105 ug/dL (ref 31–171)
Total Iron Binding Capacity: 375 ug/dL (ref 270–535)
Transferrin Saturation: 28 % (ref 10–45)
Transferrin: 268 mg/dL (ref 192–382)

## 2016-03-20 LAB — HEPATITIS A ACUTE IGM AB: Hepatitis A IgM antibody: NONREACTIVE

## 2016-03-20 LAB — HEPATITIS C AB WITH REFLEX PCR: Hepatitis C Antibody w/Rflx PCR: NONREACTIVE

## 2016-03-20 LAB — HEPATITIS A IMMUNE STATUS IGG: Hepatitis A IgG Antibody: NONREACTIVE

## 2016-03-20 LAB — SED RATE: Erythrocyte Sedimentation Rate: 12 mm/h (ref 0–20)

## 2016-03-20 LAB — THYROID STIMULATING HORMONE: Thyroid Stimulating Hormone: 2.074 u[IU]/mL (ref 0.400–5.000)

## 2016-03-20 NOTE — Patient Instructions (Signed)
I will message you as soon as I see the results. Stop the doxycycline and avoid any meds except vitamin D

## 2016-03-20 NOTE — Progress Notes (Signed)
[facility] Note      Chief Complaint   Patient presents with   . Radiology     results        Issues Discussed/HPI:    1.  Claudia Jensen is in at my request for follow up of her abdominal US which revealed suggestion of fatty liver. She has a healthy diet, does not drink, is not taking any supplements and is a healthy body weight. No FH of liver disease except for the father who had hepatitis (uncertain what kind). She does take doxy daily for her gum disease and has been on this for 18 months, and reports some nausea. Discussed today that she should stop this and discuss with dentist another treatment approach.     ROS: GI: Denies, change in appetite, dysphagia, vomiting, dyspepsia, abdominal pain    Patient Active Problem List   Diagnosis   . Anaphylactic reaction   . FH: colon cancer   . MVP (mitral valve prolapse)   . FH: melanoma   . AR (allergic rhinitis)   . Asthma   . Raynaud's disease   . Acne vulgaris       Current Outpatient Prescriptions   Medication Sig Dispense Refill   . Cholecalciferol (VITAMIN D) 2000 UNITS Oral Cap Take 2,000 Units by mouth daily.     . Doxycycline Hyclate 100 MG Oral Tab Take 20 mg by mouth every 12 hours.      Marland Kitchen EPINEPHrine 0.3 MG/0.3ML Injection Solution Auto-injector Inject 0.3 mL (0.3 mg) intramuscularly one time as needed for anaphylaxis. Inject into the thigh as instructed per patient package insert. 1 each 0   . Pediatric Multivitamins-Fl (MULTI VIT/FL OR)      . Tretinoin 0.05 % External Cream Apply sparingly at bedtime to all areas prone to acne.  May cause irritation then tolerance develops. (Patient not taking: Reported on 02/26/2016) 45 g 6     No current facility-administered medications for this visit.        Review of patient's allergies indicates:  Allergies   Allergen Reactions   . Bee Venom Anaphylaxis   . Wasp Venom Anaphylaxis       Physical Exam:  BP 121/74   Pulse 60   Wt 143 lb 6.4 oz (65 kg)   BMI 20.72 kg/m   Constitutional: Appearance:  Well developed, appearing stated age and in no acute distress    Lab review: orders written for new lab studies as appropriate; see orders.     Assessment and Plan:  Claudia Jensen was seen today for radiology.    Diagnoses and all orders for this visit:    Fatty liver with no real risk factors and will pursue work up of other causes  -     ANTI NUCLEAR ANTIBODIES  -     SED RATE  -     Hepatitis C Ab with REFLEX PCR  -     FERRITIN  -     IRON BINDING CAPACITY/TOT IRON  -     ANTI SMOOTH MUSCLE ANTIBODY  -     THYROID STIMULATING HORMONE  -     ALPHA-1 ANTITRYPSIN  -     ANTI LKM ANTIBODIES  -     US ABDOMEN DOPPLER COMPLETE  -     HEPATITIS B SURFACE AG & AB  -     Hepatitis A Immune Status IgG  -     Hepatitis A Acute IgM Ab  Patient Ed:   Patient Instructions   I will message you as soon as I see the results. Stop the doxycycline and avoid any meds except vitamin D    Follow-up:  As labs come through, I will message her.     [place], [place]  Julian  [address] [place] [address]

## 2016-03-21 LAB — ANTI NUCLEAR ANTIBODIES: Antibodies to Nuclear Ags by IF (ANA): NEGATIVE

## 2016-03-21 LAB — ANTI LKM ANTIBODIES: Anti LKM Antibodies: NEGATIVE

## 2016-03-21 LAB — ANTI SMOOTH MUSCLE AB: Anti Smooth Muscle Antibody: NEGATIVE

## 2016-03-25 ENCOUNTER — Ambulatory Visit: Payer: No Typology Code available for payment source | Attending: Registered Nurse

## 2016-03-25 DIAGNOSIS — K760 Fatty (change of) liver, not elsewhere classified: Secondary | ICD-10-CM | POA: Insufficient documentation

## 2016-03-31 ENCOUNTER — Encounter (HOSPITAL_BASED_OUTPATIENT_CLINIC_OR_DEPARTMENT_OTHER): Payer: Self-pay | Admitting: Registered Nurse

## 2016-03-31 DIAGNOSIS — R935 Abnormal findings on diagnostic imaging of other abdominal regions, including retroperitoneum: Secondary | ICD-10-CM

## 2016-03-31 NOTE — Telephone Encounter (Signed)
[phone] called to see if Claudia Jensen has a moment to talk to her.

## 2016-04-01 NOTE — Telephone Encounter (Signed)
Discussed [place] with Lyndee Leo. Will do an econsult for ring down effect noted above the right hemi diaphragm.

## 2016-04-02 NOTE — Addendum Note (Signed)
Addended by: Thea Silversmith on: 04/02/2016 02:18 PM     Modules accepted: Orders

## 2016-04-02 NOTE — Addendum Note (Signed)
Addended by: Thea Silversmith on: 04/02/2016 02:21 PM     Modules accepted: Orders

## 2016-04-04 ENCOUNTER — Other Ambulatory Visit (HOSPITAL_BASED_OUTPATIENT_CLINIC_OR_DEPARTMENT_OTHER): Payer: Self-pay | Admitting: Pulmonary Disease

## 2016-04-04 DIAGNOSIS — R9389 Abnormal findings on diagnostic imaging of other specified body structures: Secondary | ICD-10-CM

## 2016-04-04 DIAGNOSIS — R938 Abnormal findings on diagnostic imaging of other specified body structures: Secondary | ICD-10-CM

## 2016-04-04 NOTE — Progress Notes (Signed)
Reason for eConsult:  Thea Silversmith, ARNP submitted the following request:  I am requesting an eConsult for this 46 year old female with abnormal abdominal US done to evaluate fatty liver changes and noted     2. Minimal ringdown artifact emanating from the right hemidiaphragm which   could suggest a mild degree of underlying diffuse parenchymal lung disease.    My clinical question is: This is an incidental finding and she is otherwise very healthy and fit. No tobacco history and no FH lung disease. No respiratory symptoms and is training for a 31 mile run/walk. No history of recent lung infection.  What is your opinion regarding any additional work up given the clinical scenario? I have not gotten any lung imaging but am happy to do so.     Please have the following radiology images available in [facility], [facility] or [facility]:   - Recent CXR or Chest CT  -Prior images for comparison available not done      If this clinical question is deemed too complex for eConsult, please  route back to me and I will discuss further with the patient.  Thanks!!  Thea Silversmith, ARNP  04/02/2016  _____________________________________________________________________  After careful review of the patient's results above and the patient's information available in the medical record,  the following are my findings and recommendations:  Darcus Austin, MD  04/04/2016    1. Restatement of the question:   46yo F who underwent abdominal ultrasound imaging and was found to have an artifact suggestive of possible lung disease.    2. Recommendation(s):     Discuss risk/benefit of screening chest CT (to rule out diffuse lung disease) versus risk tolerance for strategy of watchful waiting.  We are also happy to see her in chest clinic to have this discussion with her and screen for other possible exposures.    3. Rationale and/or evidence for recommendation:     One reference for your perusal is below.   The caveat in this study is that these patients were all undergoing liver transplantation thus the results may not necessarily be generalizable to the population at large.  Also, the degree of ring artifact on this patient ("B grade" in the paper) would be in the lower likelihood of having lung disease.  http://journal.chestnet.org/article/S0012-3692(17)30920-0/pdf  Dubinksy et al. Correlation of B-Lines on Ultrasonography With Interstitial Lung Disease on Chest Radiography and CT Imaging. Chest 2017; 152(5):990-998.    It is possible she has asymptomatic lung disease at an early stage and thus not have any activity limitations at this point.  In this case, or in general when there are incidental findings, the next steps depend on a risk/benefit and risk tolerance/avoidance perspective of the patient and the disease.  In this case, I would discuss the low likelihood of the outcome (lung disease) along with the low risk of the intervention (chest CT) and get her perspective on "knowing the answer" versus a strategy of "watchful waiting" and getting cross sectional imaging of the chest if there is a concern down the road.    4. Contingency plan:   Refer to chest clinic, chest CT.    I spent a total time of 25 minutes reviewing and communicating the above findings and recommendations.    "This eConsult is based solely on the clinical information available to me in the patient's medical record and is provided without benefit of a comprehensive evaluation or physical examination of the patient. The information contained in  this eConsult must be interpreted in light of any clinical issues or changes in patient status that were not known to me at the time this eConsult was completed. You must rely on your own informed clinical judgment for decision making. If necessary, we can schedule the patient for an in-office consultation."

## 2016-04-07 ENCOUNTER — Encounter (HOSPITAL_BASED_OUTPATIENT_CLINIC_OR_DEPARTMENT_OTHER): Payer: Self-pay | Admitting: Registered Nurse

## 2016-04-07 NOTE — Telephone Encounter (Signed)
Fwd to Rn pool.

## 2016-07-17 ENCOUNTER — Other Ambulatory Visit (HOSPITAL_BASED_OUTPATIENT_CLINIC_OR_DEPARTMENT_OTHER): Payer: Self-pay | Admitting: Registered Nurse

## 2016-07-17 ENCOUNTER — Other Ambulatory Visit (HOSPITAL_BASED_OUTPATIENT_CLINIC_OR_DEPARTMENT_OTHER): Payer: Self-pay | Admitting: Dermatology

## 2016-07-17 DIAGNOSIS — L700 Acne vulgaris: Secondary | ICD-10-CM

## 2016-07-17 DIAGNOSIS — Z87892 Personal history of anaphylaxis: Secondary | ICD-10-CM

## 2016-07-17 MED ORDER — TRETINOIN 0.05 % EX CREA
TOPICAL_CREAM | CUTANEOUS | 6 refills | Status: DC
Start: 2016-07-17 — End: 2017-09-07

## 2016-07-17 NOTE — Telephone Encounter (Signed)
S:  Tretinoin 0.05 % External Cream RF.    B: Received ERX Tretinoin 0.05 % External Cream RF request.    Medication Quantity Refills Start End   Tretinoin 0.05 % External Cream 45 g 6 07/19/2015    Sig: Comment:   Apply sparingly at bedtime to all areas prone to acne. May cause irritation then tolerance develops.      No future visit scheduled.    Per excerpt of 08/28/15 last office visit note of Dr. Jim Desanctis:    DERMATOLOGY PROBLEM LIST:  Psoriasis   Lentigines   Warts   Onycholysis  FHx of melanoma (father)  Acne vulgaris    DERMATOLOGY MEDS AND TREATMENTS:   Spironolactone 100 mg daily  Tretinoin 0.05%    Claudia Jensen was last seen 07/19/2015 during which she had intralesional candida 0.2 ml on her R middle finger.  -  Acne vulgaris:  Spironolactone 100 mg daily and tretinoin 0.05% qhs.    A/R:   Rx pending and routed to MD for approval.

## 2016-07-17 NOTE — Telephone Encounter (Signed)
From: Young Berry  Sent: 07/17/2016 10:19 AM PDT  Subject: Medication Renewal Request    Claudia Jensen would like a refill of the following medications:     EPINEPHrine 0.3 MG/0.3ML Injection Solution Auto-injector Thea Silversmith, ARNP]   Patient Comment: Expired so need new one    Preferred pharmacy: [facility] [address] [address] [phone]         Medication renewals requested in this message routed separately:     Tretinoin 0.05 % External Cream Lily Lovings, MD]   Patient Comment: New insurance so need new prescription

## 2016-07-18 MED ORDER — EPINEPHRINE 0.3 MG/0.3ML IJ SOAJ
0.3000 mg | Freq: Once | INTRAMUSCULAR | 0 refills | Status: AC | PRN
Start: 2016-07-18 — End: 2016-07-18

## 2016-09-12 ENCOUNTER — Encounter (HOSPITAL_BASED_OUTPATIENT_CLINIC_OR_DEPARTMENT_OTHER): Payer: Self-pay | Admitting: Registered Nurse

## 2016-09-12 DIAGNOSIS — K760 Fatty (change of) liver, not elsewhere classified: Secondary | ICD-10-CM

## 2016-09-16 NOTE — Telephone Encounter (Signed)
Please schedule her for limited US of liver which is ordered. Thanks

## 2016-09-17 ENCOUNTER — Telehealth (HOSPITAL_BASED_OUTPATIENT_CLINIC_OR_DEPARTMENT_OTHER): Payer: Self-pay | Admitting: Unknown Physician Specialty

## 2016-09-17 DIAGNOSIS — K760 Fatty (change of) liver, not elsewhere classified: Secondary | ICD-10-CM

## 2016-09-17 NOTE — Telephone Encounter (Signed)
Phone call received from [place] in [facility] [phone]. She states she has the pt on the other line and there is confusion regarding her imaging orders. [place] ABD Limited to rule out hernia ordered by Stefano Gaul, ARNP on 09/16/16. However, per pt understanding (and chart notes) abd [place] indication is to evaluate fatty changes in liver.  Per Anda Kraft in [place] [facility] Abd doppler complete correct order for reevaluation of fatty liver (the same study that was done on 03/25/2016).    Routed to Stefano Gaul for clarification of correct abd [place] order to reevaluate fatty liver. Plan: call Anda Kraft at [facility] at [phone] after order is clarified, who will then contact pt.

## 2016-09-17 NOTE — Telephone Encounter (Signed)
Phone call to Anda Kraft at [facility], informed her the limited abd [place] order has been placed. Anda Kraft will call the pt to schedule.

## 2016-09-17 NOTE — Telephone Encounter (Signed)
The order is in the chart for limited abd [place]. Do not need the doppler.

## 2016-10-29 ENCOUNTER — Encounter (HOSPITAL_BASED_OUTPATIENT_CLINIC_OR_DEPARTMENT_OTHER): Payer: No Typology Code available for payment source

## 2016-11-07 ENCOUNTER — Ambulatory Visit: Payer: No Typology Code available for payment source | Attending: Registered Nurse

## 2016-11-07 DIAGNOSIS — K769 Liver disease, unspecified: Secondary | ICD-10-CM | POA: Insufficient documentation

## 2016-11-13 ENCOUNTER — Other Ambulatory Visit: Payer: Self-pay

## 2017-02-10 ENCOUNTER — Encounter (HOSPITAL_BASED_OUTPATIENT_CLINIC_OR_DEPARTMENT_OTHER): Payer: Self-pay | Admitting: Registered Nurse

## 2017-02-10 DIAGNOSIS — Z1231 Encounter for screening mammogram for malignant neoplasm of breast: Secondary | ICD-10-CM

## 2017-02-26 ENCOUNTER — Ambulatory Visit: Payer: No Typology Code available for payment source | Attending: Registered Nurse

## 2017-02-26 DIAGNOSIS — Z1231 Encounter for screening mammogram for malignant neoplasm of breast: Secondary | ICD-10-CM | POA: Insufficient documentation

## 2017-09-07 ENCOUNTER — Encounter (HOSPITAL_BASED_OUTPATIENT_CLINIC_OR_DEPARTMENT_OTHER): Payer: Self-pay | Admitting: Registered Nurse

## 2017-09-07 ENCOUNTER — Ambulatory Visit: Payer: No Typology Code available for payment source | Attending: Registered Nurse | Admitting: Registered Nurse

## 2017-09-07 VITALS — BP 145/77 | HR 60 | Ht 68.5 in | Wt 140.0 lb

## 2017-09-07 DIAGNOSIS — Z0000 Encounter for general adult medical examination without abnormal findings: Secondary | ICD-10-CM | POA: Insufficient documentation

## 2017-09-07 DIAGNOSIS — Z124 Encounter for screening for malignant neoplasm of cervix: Secondary | ICD-10-CM

## 2017-09-07 DIAGNOSIS — L409 Psoriasis, unspecified: Secondary | ICD-10-CM | POA: Insufficient documentation

## 2017-09-07 DIAGNOSIS — L700 Acne vulgaris: Secondary | ICD-10-CM | POA: Insufficient documentation

## 2017-09-07 DIAGNOSIS — Z1151 Encounter for screening for human papillomavirus (HPV): Secondary | ICD-10-CM | POA: Insufficient documentation

## 2017-09-07 MED ORDER — TRETINOIN 0.05 % EX CREA
TOPICAL_CREAM | CUTANEOUS | 6 refills | Status: AC
Start: 2017-09-07 — End: ?

## 2017-09-07 MED ORDER — FLUOCINONIDE 0.05 % EX SOLN
Freq: Two times a day (BID) | CUTANEOUS | 3 refills | Status: AC
Start: 2017-09-07 — End: ?

## 2017-09-07 NOTE — Progress Notes (Deleted)
[facility]  Preventive Health Visit    ID/CC:  Claudia Jensen is a 47 year old female presenting for an annual preventive visit.    I reviewed the patients current [facility] Preventive Health Visit form, and I reviewed her {history review:109953} as documented in Epic, {created:109322} at this visit. {Send form to scanning! Recommend review of PMH, PSH, [place], Social Hx in [place]. Form does not have a multisystem ROS, nor is ROS required}    Lifestyle is healthy & low-risk in the following areas:   Diet:  {YES/NO:101829}   Exercise:  {YES/NO:101829}   Physical safety & risk of injury:  {YES/NO:101829}   Exposure to tobacco:  {YES/NO:101829}  Exposure to alcohol: {YES/NO:101829}  Use of other substances: {YES/NO:101829}      Counseling on lifestyle & health habits at this visit:  ***    The following areas pose potential risk to health:   Depression:  {NO/YES:107423::"No"}   Cancer risk factors:  {NO/YES:107423::"No"}  Cardiovascular risk factors:  {NO/YES:107423::"No"}   Potential for exposure to infection: {NO/YES:107423::"No"}  Potential for unintended pregnancy:  {NO/YES:107423::"No"}      Counseling on screening, prevention, & preventive management at this visit: ***    Other topics discussed:     *** {Discussion of contraception, pregnancy planning, other anticipatory guidance, normal menstrual symptoms, general health questions, or minor health issues can be part of a preventive visit, but if any significant, separately identifiable evaluation, management, or unique detailed counseling is done at this visit, document & code as E/M + modifier -25}    Health Maintenance   Topic Date Due    Cervical Cancer Screening  03/16/2017    Influenza Vaccine (1) 10/13/2017    Colorectal Cancer Screening (Colonoscopy)  08/13/2018    Depression Screening (PHQ-2)  09/08/2018    Tetanus Vaccine  07/15/2023    HIV Screening  Addressed    Pneumococcal Vaccine: Pediatrics (0-5 years) and At-Risk  Patients (6-64 years)  Aged [employer] History   Administered Date(s) Administered    Tdap vaccine 07/14/2013    influenza vaccine quadrivalent PF 11/15/2014       Physical exam:  Vitals: BP 139/83    Pulse 63    Ht 5' 8.5" (1.74 m)    Wt 140 lb (63.5 kg)    LMP 09/04/2017 (Exact Date)    BMI 20.98 kg/m   PHYSICAL EXAM:  General: {GENERAL APPEARANCE:50::"healthy","alert","no distress"}  Skin: {SKIN EXAM:101::"Skin color, texture, turgor normal. No rashes or concerning lesions"}  Head: {HEAD EXAM:301::"Normocephalic. No masses, lesions, tenderness or abnormalities"}  Eyes: {EYE EXAM:201::"Lids/periorbital skin normal","Conjunctivae/corneas clear","PERRL","EOM's intact"}  Ears: {EAR EXAM:322::"External ears normal. Canals clear. TM's normal."}  Nose:{NOSE BRIEF EXAM:316::"normal"}  Oropharynx: {OROPHARYNX EXAM:303::"Lips, mucosa, and tongue normal. Teeth and gums normal.","posterior pharynx without erythema or drainage"}  Neck: {NECK EXAM:304::"supple. No adenopathy. Thyroid symmetric, normal size, without nodules"}  Lungs: {LUNGS BRIEF EXAM:404::"clear to auscultation"}  Heart: {HEART BRIEF EXAM:513::"normal rate, regular rhythm","no murmurs, clicks, or gallops"}  Back: {BACK EXAM:801::"Back symmetric, no deformity; ROM normal; No CVA tenderness."}  Abd: {ABDOMEN EXAM:601::"soft, non-tender. BS normal. No masses or organomegaly"}    Breasts:  Inspection:  {BREASTS - INSPECTION:103735::"Normal breast symmetry,  normal nipples without discharge.No skin lesions."}  Palpation: {BREAST - PALPATION:103736::"Normal breast tissue bilaterally, no tenderness, no suspicious masses."}  Axillary nodes: {LYMPHATIC - AXILLARY NODES:103718::"No adenopathy."}  Pelvic:  {PELVIC:709::"External genitalia normal","normal bartholin/skene/urethral meatus/anus.","Vagina is rugated and well-estrogenized","cervix normal in appearance","no CMT","no bladder tenderness","uterus normal size,  shape, and consistency","no adnexal  masses or tenderness"}.     Assessment & Plans:  47 year old female for annual preventive visit.    Preventive counseling at this visit (in addition to comments above): ***     Routine medication refills were done as part of this visit: {NO/YES:107423::"No"}    Diagnoses & orders for this visit:     Claudia Jensen was seen today for health maintenance visit.    Diagnoses and all orders for this visit:    Routine general medical examination at a health care facility    Screening for cervical cancer          See AVS for patient education and counseling reviewed today    Follow up in *** year(s) or sooner with additional concerns.      Stefano Gaul, [place], ARNP  [facility]

## 2017-09-07 NOTE — Patient Instructions (Signed)
Use the medicated shampoos nightly until the symptoms have cleared, then go back to maintenance    New order for fluocinlone for the spots bothering you now.     Six tips to help improve your health and wellness      Eat well to give your body the energy it needs.    Stay or get active.    A healthy mind is part of a healthy body.    Practice safe living habits.    Keep your mind and body free of harmful drugs and alcohol.    Get regular health care.     Tip #1: Eat well to give your body the energy it needs   Your body needs nutritious foods to stay strong and healthy. Here are some general eating guidelines:   Eat real food (not processed, packaged, and already made)   Eat adequate vegetables and fruits (6 servings daily). These foods contain invaluable micronutrients, phytochemicals, antioxidants and fiber.   Choose whole grains over simple carbs. (brown rice over white rice)   Eat more fiber (legumes, fresh fruit, ground flax seeds)  Eat healthy fats (olive oil, nuts, avocados, essential fatty acids) and avoid trans fats and high saturated fats.   Eat adequate protein (0.5 g/lb body weight daily) from lean sources including: poultry, eggs, legumes, tofu, dairy products, nuts and seeds. Limit red meat to once a week.   Eat calcium rich foods (milk, cheese, yogurt, kale legumes, tofu, figs, sardines), three to four servings daily. Calcium is best absorbed through food.   Eliminate the intake of refined sugar and refined carbohydrates.   Eat healthy snacks with protein source.   Keep salt intake low. Less than 1500 mg daily.   Drink adequate water--generally 64 ounces daily.   Avoid sugary drinks (juice and soda)   Limit caffeine to no more than three to four cups a day. Less, if it causes you to have symptoms    Tip #2: Stay or get active     Exercise and move as much as possible. Most research suggests at least 150 minutes a week is necessary to improve health and well being. This equals a minimum of:  22  minutes, every day, or   30 minutes on 5 days/week, or   50 minutes, 3 times a week, or                  1 hr 15 min, twice a week  Regular physical activity can help you:    Live longer and feel better    Be stronger and more flexible    Build strong bones    Prevent depression and anxiety    Strengthen your immune system    Maintain a healthy body weight    Improve brain function and generate new brain cells   There is growing evidence that people that sit for prolonged periods have greater health risks. If you have a desk job, try to get up at least four times an hour, or as much as possible throughout the day.     Tip #3: Remember: A healthy mind is part of a healthy body    A good state of mind can help you make healthy choices. Here are a few tips for keeping your mind healthy:    Reduce stress in your life!  Stress is one of the main causes of illness.   Make some time every day for things that are fun and/or pleasant  Get enough sleep. Lack of sleep reduces how well you can concentrate, increases mood swings, and raises your risks of accidents   Consider mindfulness training/meditation-10 minutes a day is all you need to meditate   Ask your health care provider for help if you feel depressed or anxious for more than two weeks in a row.     Tip #4: Practice safe living habits     Accidents and Injuries    Accidents and injuries are the 5th leading cause of death in the [place]    Women under age 51 are more likely to die in motor vehicle accidents than from any other cause.    Accidents in the home cause thousands of permanent injuries every year. The most common accidents are fires, falls, and drowning. To help yourself and your family stay safe:    Install smoke detectors on each floor of your home.    Make sure everyone in your family knows how to swim   Stay safe on the road:  o Wear a seatbelt.   o Do not ride with someone who has been drinking or taking drugs.   o Do not speak on a  cell phone or send, read, or write text messages while you are driving.   o Wear a helmet when you ride a bicycle or motorcycle.   o Get enough sleep at night, and do not drive when you are tired.     Hand Hygiene   Protect yourself from germs by washing your hands often. Always wash your hands:    After you change a diaper or use the toilet    Before you start and after you finish preparing food     Tip #5: Keep your mind and body free of harmful drugs and alcohol      Tobacco causes more health problems than any other substance. These problems include lung disease, heart disease, and many types of cancer. The nicotine in tobacco is the most addictive and widely used drug.    Too much alcohol can cause damage to your liver, heart, brain, bones, and other body tissues. Being under the influence of alcohol also increases your chance of being injured in an accident. Alcohol can cause fetal alcohol syndrome in your children if you drink regularly when you are pregnant.    Street drugs like cocaine, methamphetamine, heroin, or pain pills not prescribed by your provider can harm your health. They may be mixed with harmful substances, and using them can cause people to put themselves in dangerous situations.     Tip #6: Get regular health care     Many people think they need to see their provider only when they are sick. But, health care providers can also help you stay healthy.    Find a health care provider who works with you to manage your health.    Ask your health care provider what diseases you are at risk for. Learn what you can do to prevent or control them.    Get yourself and your family immunized against life-threatening diseases.   Don't save your concerns for your annual check-up. Come in as they occur.  Please be sure to get vitamin D and calcium in order to protect bones.      1)  Vitamin D can be taken once a day and, when present in the body, helps you absorb calcium and get it into your bones.       The amount currently recommended  is 800 - 2000 IU/day.      It's best to get Vitamin D from daily supplement, as it's hard to get from diet.      It's in fortified milk at 100 IU/cup but not in most brands of yogurt.  Other sources are: egg yolks, liver, mushrooms, and fatty fish (like salmon).      It's "the sunshine vitamin", but in the [place] it's hard to get enough sun - and enough light of the right wavelengths - for [place] to make the amount of Vitamin D that we need.    2)  Calcium is crucial because your body needs to have a steady blood level of calcium for your brain, nerves, muscles, and heart to function normally, and we all lose some calcium throughout the day in our urine, so if there's not enough calcium coming in from your diet, it will be taken from your bones.     The amount recommended is 1200 mg/day.  If possible, it's better to get it from diet than from supplements.    Whether from diet or calcium supplements, calcium should be spread out over 2-3 meals.  More than 600 mg should not be taken all at once, because there's a limit to how much can be absorbed at one time.  Calcium supplements should not be taken on an empty stomach, because that increases the risk of kidney stones.    Dietary sources are better absorbed than supplements.  Foods high in calcium reduce rather than raise the risk of kidney stones, and they may reduce the risk of heart disease in the long run:    - Milk contains 300 mg of calcium per cup.  - Yogurt contains 400 mg of calcium per cup.  - Calcium-fortified foods vary in their calcium content. Check the label & reduce by half the number listed as "% of daily requirement", meaning that a food labeled to say that 1 serving provides "20% of daily requirement", actually provides 10% of your goal of 1200 mg.    Collard greens, cooked 1 cup 357   Soy, rice, and  almond milk (calcium fortified) 8 ounces 150-300   Tofu, processed with calcium sulfate* 4 ounces 200-420    Calcium-fortified orange juice 8 ounces 350   Soy yogurt, plain 6 ounces 300   Spinach, cooked 1 cup 250   Tofu, processed with nigari* 4 ounces 130-400   Tempeh 1 cup 184   Kale, cooked 1 cup 179   Soybeans, cooked 1 cup 175   Bok choy, cooked 1 cup 158   Mustard greens, cooked 1 cup 152   Chick peas (canned) 1/2 cup 40   Tahini 2 Tbsp 128   Navy beans, cooked 1 cup 126   Almond butter 2 Tbsp 111   Almonds, whole 1/4 cup 94   Broccoli, cooked 1 cup 62   Dried figs 1/2 cup 150   Instant oatmeal  1 package 100-150   Blackstrap Molasses 1 Tbs 140     Calcium supplements are useful for people who find their diet doesn't provide the full recommended amount.  In that case, add a calcium supplement 315 - 600 mg, 2-3 times a day with meals to provide up to 1200 mg total per day, as either of the following:    a) Calcium citrate (best absorbed) - for example, Citracal 315 mg, 1-2 twice daily with 1-2 meals, or    b) Calcium carbonate (absorbed well, comes in more forms  than calcium citrate, including chewable forms) - for example, Tums 500 or Viactiv, 1 twice daily

## 2017-09-07 NOTE — Progress Notes (Signed)
[place]  Preventive Health Visit    ID/CC:  Claudia Jensen is a 47 year old female presenting for an annual preventive visit.    I reviewed the patients current [facility] Preventive Health Visit form, and I reviewed her ROS, past medical history, past surgical history, family history and social history as documented in Epic, confirmed at this visit.    Lifestyle is healthy & low-risk in the following areas:   Diet:  YES    Exercise:  YES    Physical safety & risk of injury:  YES    Exposure to tobacco:  YES   Exposure to alcohol: YES   Use of other substances: YES       Counseling on lifestyle & health habits at this visit:  Per AVS    The following areas pose potential risk to health:   Depression:  No   Cancer risk factors:  Yes colon and skin cancer and up to date on screening  Cardiovascular risk factors:  No   Potential for exposure to infection: No  Potential for unintended pregnancy:  No      Counseling on screening, prevention, & preventive management at this visit: per AVS    Other topics discussed:     Claudia Jensen is doing great and feeling well. She has no new concerns today with the exception of scalp psoriasis flare. Using the medicated shampoos around two to three times a week and advised to increase use for flares. She is very active and exercises regularly. Good diet and her IBS is quiet at this time. May be related to her intermittent fasting program.     [name] is going to [place] and her oldest is at the [employer] as a Equities trader. She had a bout of severe fatigue and reactivation of her EBV and is doing better now. Living at home with parents for now as she is saving money for a PhD program.     Health Maintenance   Topic Date Due    Cervical Cancer Screening  03/16/2017    Influenza Vaccine (1) 10/13/2017    Colorectal Cancer Screening (Colonoscopy)  08/13/2018    Depression Screening (PHQ-2)  09/08/2018    Tetanus Vaccine  07/15/2023    HIV Screening  Addressed     Pneumococcal Vaccine: Pediatrics (0-5 years) and At-Risk Patients (6-64 years)  Aged [employer] History   Administered Date(s) Administered    Tdap vaccine 07/14/2013    influenza vaccine quadrivalent PF 11/15/2014       Physical exam:  Vitals: BP (!) 145/77    Pulse 60    Ht 5' 8.5" (1.74 m)    Wt 140 lb (63.5 kg)    LMP 09/04/2017 (Exact Date)    BMI 20.98 kg/m   PHYSICAL EXAM:  General: healthy, alert, no distress  Neck: supple. No adenopathy. Thyroid symmetric, normal size, without nodules  Lungs: clear to auscultation  Heart: normal rate, regular rhythm and no murmurs, clicks, or gallops  Breasts:  Inspection:  Normal breast symmetry,  normal nipples without discharge.No skin lesions.  Palpation: Normal breast tissue bilaterally, no tenderness, no suspicious masses.  Axillary nodes: No adenopathy.  Pelvic:  External genitalia normal, normal bartholin/skene/urethral meatus/anus., Vagina is rugated and well-estrogenized, cervix normal in appearance.     Assessment & Plans:  47 year old female for annual preventive visit.    Preventive counseling at this visit (in addition to comments  above): per AVS     Routine medication refills were done as part of this visit: Yes    Diagnoses & orders for this visit:     Elika was seen today for health maintenance visit.    Diagnoses and all orders for this visit:    Routine general medical examination at a health care facility    Screening for cervical cancer  -     Cervical (and Vaginal) Cancer Screening    Scalp psoriasis  -     fluocinonide 0.05 % External Solution; Apply to affected area on scalp 2 times a day. For one week, then prn    Acne vulgaris  -     tretinoin 0.05 % External Cream; Apply sparingly at bedtime to all areas prone to acne.  May cause irritation then tolerance develops.      See AVS for patient education and counseling reviewed today    Follow up in 1 year(s) or sooner with additional concerns.      Stefano Gaul, [place], ARNP  [place]

## 2017-09-11 LAB — HPV ONLY
Cytologic Impression: NEGATIVE
HPV Presence: NEGATIVE

## 2017-09-11 LAB — CERVICAL CANCER SCREENING
Cytologic Impression: NEGATIVE
LAB AP HISTORIC HPV PRESENCE: NEGATIVE

## 2017-10-14 ENCOUNTER — Ambulatory Visit: Payer: No Typology Code available for payment source | Attending: Registered Nurse | Admitting: Registered Nurse

## 2017-10-14 ENCOUNTER — Encounter (HOSPITAL_BASED_OUTPATIENT_CLINIC_OR_DEPARTMENT_OTHER): Payer: Self-pay | Admitting: Registered Nurse

## 2017-10-14 ENCOUNTER — Ambulatory Visit (HOSPITAL_BASED_OUTPATIENT_CLINIC_OR_DEPARTMENT_OTHER): Payer: No Typology Code available for payment source

## 2017-10-14 VITALS — BP 124/78 | HR 72 | Temp 98.7°F

## 2017-10-14 DIAGNOSIS — R339 Retention of urine, unspecified: Secondary | ICD-10-CM | POA: Insufficient documentation

## 2017-10-14 DIAGNOSIS — K5900 Constipation, unspecified: Secondary | ICD-10-CM | POA: Insufficient documentation

## 2017-10-14 DIAGNOSIS — Z87898 Personal history of other specified conditions: Secondary | ICD-10-CM

## 2017-10-14 DIAGNOSIS — Z23 Encounter for immunization: Secondary | ICD-10-CM | POA: Insufficient documentation

## 2017-10-14 DIAGNOSIS — K580 Irritable bowel syndrome with diarrhea: Secondary | ICD-10-CM | POA: Insufficient documentation

## 2017-10-14 LAB — THYROID STIMULATING HORMONE: Thyroid Stimulating Hormone: 1.621 u[IU]/mL (ref 0.400–5.000)

## 2017-10-14 NOTE — Progress Notes (Signed)
Influenza Vaccine 2019-2020 Season    Screening questions for Flu Vaccine needed?  (answer YES to consider appropriateness for Flu Shot or FluMist)  Yes Flu Vaccine Screening Questions:    1) Do you want to receive a flu vaccine?  YES    2) Have you already had a flu shot this season (since 08/2017)?  NO    3) Currently have a moderate or severe illness, including fever? NO    4) Ever had a serious allergic reaction to eggs?  NO    5) Ever had a serious reaction to a flu vaccine or another vaccine in the past? NO        6) Ever had Guillain-Barre syndrome (a form of paralysis) associated within 6 weeks after receiving  a vaccine? NO      If YES to any of the questions #2 - #6 above - NO Flu Vaccine to be given.  Patient may consult provider as needed.    If NO to all questions #2 - #6 above - Patient may receive Flu Shot (IM)      Immunization Documentation:    Patient/Parent has reviewed the appropriate VIS and has all questions answered? YES    Vaccine(s) given today without initial adverse effect? YES

## 2017-10-14 NOTE — Progress Notes (Signed)
[facility] Note      Chief Complaint   Patient presents with    Abdominal Pain     worse last 2wk, ongoing        Issues Discussed/HPI:    Was on vacation in [place] and ate more pastries and this is not something that is typical for her as she avoids carbs like this. She has noted more gas and constipation since her trip, despite the fact that she is back to her regular diet. She is constipated and feels incomplete emptying. Recent menses. She is taking a probiotic and fiber daily. She is moving her bowels daily but still feels full. Known history of IBS and not using a fiber supplement on a regular basis but does have a very healthy diet.      ROS constitutional sx, melena, BRBPR, N/V    Patient Active Problem List   Diagnosis    Anaphylactic reaction    FH: colon cancer    MVP (mitral valve prolapse)    FH: melanoma    AR (allergic rhinitis)    Asthma    Raynaud's disease    Acne vulgaris    Irritable bowel syndrome with diarrhea       Current Outpatient Medications   Medication Sig Dispense Refill    Cholecalciferol (VITAMIN D) 2000 UNITS Oral Cap Take 2,000 Units by mouth daily.      EPINEPHrine 0.3 MG/0.3ML Injection Solution Auto-injector Inject 0.3 mL (0.3 mg) intramuscularly one time as needed for anaphylaxis. Inject into the thigh as instructed per patient package insert. 1 each 0    fluocinonide 0.05 % External Solution Apply to affected area on scalp 2 times a day. For one week, then prn 20 mL 3    Pediatric Multivitamins-Fl (MULTI VIT/FL OR)       tretinoin 0.05 % External Cream Apply sparingly at bedtime to all areas prone to acne.  May cause irritation then tolerance develops. 45 g 6     No current facility-administered medications for this visit.        Review of patient's allergies indicates:  Allergies   Allergen Reactions    Bee Venom Anaphylaxis    Wasp Venom Anaphylaxis       Physical Exam:  BP 124/78    Pulse 72    Temp 98.7 F (37.1 C) (Temporal)    Physical Exam   Constitutional: She is well-developed, well-nourished, and in no distress.   Cardiovascular: Normal rate, regular rhythm and normal heart sounds.   Abdominal: Soft. Normal appearance and bowel sounds are normal. She exhibits no distension, no fluid wave and no mass. There is no hepatosplenomegaly. There is no rigidity and no guarding.       Assessment and Plan:  Jerni was seen today for abdominal pain.    Diagnoses and all orders for this visit:    History of abdominal pain  -     XR ABDOMEN 1 VW; Future  -     Thyroid Stimulating Hormone    Constipation, unspecified constipation type  -     XR ABDOMEN 1 VW; Future  -     Thyroid Stimulating Hormone    Other orders  -     influenza vaccine quadrivalent PF (adult) 0.5 mL IM - 16967      Patient Ed:   Patient Instructions   Continue the fiber for the IBS and start on the miralax for now.     Take miralax  daily until the stool is back to normal with sense of complete bowel movement        Follow-up:  She was advised to follow up if symptoms worsen or fail to improve.     [place], [place]  [facility]  [address] [place] [address]

## 2017-10-14 NOTE — Patient Instructions (Addendum)
Continue the fiber for the IBS and start on the miralax for now.     Take miralax daily until the stool is back to normal with sense of complete bowel movement

## 2017-11-25 ENCOUNTER — Ambulatory Visit: Payer: No Typology Code available for payment source | Attending: Dermatology | Admitting: Dermatology

## 2017-11-25 DIAGNOSIS — Z808 Family history of malignant neoplasm of other organs or systems: Secondary | ICD-10-CM | POA: Insufficient documentation

## 2017-11-25 DIAGNOSIS — B079 Viral wart, unspecified: Secondary | ICD-10-CM

## 2017-11-25 DIAGNOSIS — L282 Other prurigo: Secondary | ICD-10-CM | POA: Insufficient documentation

## 2017-11-25 DIAGNOSIS — Z1283 Encounter for screening for malignant neoplasm of skin: Secondary | ICD-10-CM | POA: Insufficient documentation

## 2017-11-25 DIAGNOSIS — B078 Other viral warts: Secondary | ICD-10-CM | POA: Insufficient documentation

## 2017-11-25 DIAGNOSIS — D229 Melanocytic nevi, unspecified: Secondary | ICD-10-CM

## 2017-11-25 NOTE — Progress Notes (Signed)
If our office needs to contact you after your visit today, is it ok to leave a detailed message on your phone or e-care ?  YES      What is the preferred method of contact e-care/ phone  number?   Telephone Information:   Home Phone [phone]

## 2017-11-25 NOTE — Progress Notes (Signed)
I, Wilhemenia Camba L Kinaya Hilliker, MD, personally performed the services described in this documentation, as scribed by the medical scribe in my presence, and it is both accurate and complete.

## 2017-11-25 NOTE — Progress Notes (Signed)
[facility] [place]  l  [place]  [address]  TEL: [phone]  l  FAX: [phone]      11/25/2017    PRIMARY CARE PROVIDER:  Thea Silversmith, [id number]  [place] [address]    PATIENT: Claudia Jensen    [id number]    IDENTIFICATION/CHIEF CONCERN:    Claudia Jensen is a 47 year old female return patient seen today for lesions on left lower leg, and rash on her forearms and neck.    DERMATOLOGY MEDICAL HISTORY:  Psoriasis   Lentigines   Warts   Onycholysis  FHx of melanoma (father)  Acne vulgaris    DERMATOLOGY MEDS AND TREATMENTS:   Tretinoin 0.05%    INTRALESIONAL CANDIDA Hx:  08/28/2015 - 0.2 ml per site (R knee, volar L 3rd finger)  07/19/2015 - 0.2 ml (R middle finger)  05/15/2015 - 0.2 ml (base of R thumb)  04/12/2015 - 0.18 ml (L volar thumb)  03/22/2015 - 0.2 ml (L volar thumb)    HISTORY OF PRESENT ILLNESS:  Claudia Jensen was last seen 08/28/2015, at which time warts on the right knee and left 3rd finger were treated with candida injections.    Since the last visit, she has noticed a changing mole on the left lower leg. The color appears different.     She reports that she develops itchy "lumps" on her arms, lower cheeks, and neck. She initially thought that they were bed bug. The lesions would flare and then calm. This has been going on for months, but she reports that the lumps on the lower cheeks have been present for over a year. No one in her family have a similar rash. She has tried OTC hydrocortisone without benefit. The itching comes and goes throughout the day. She read somewhere that an itching could be associated with a liver disease. 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 She has asthma and rarely requires asthma. Since moving to [place], her asthma has remained very stable.    Her warts on her hands have responded well to candida injections,  however, she has a persistent wart on her left index finger.     ROS:  Gen - feels well, No unintended weight loss  Skin - No other skin complaints  Heme/lymph - No new lumps or bumps under the skin    SOCIAL HISTORY  Teaches physical therapy assistants. Has 2 children ages 4 and 58. No pets Grew up in [place]. She reports that she has never smoked. She has never used smokeless tobacco. She reports current alcohol use. She reports that she does not use drugs.    FAMILY HISTORY:  Father with melanoma    PHYSICAL EXAMINATION:  Vital Signs: not currently breastfeeding.  General: She is a well appearing adult female in no acute distress.  Normal development, and nutrition.  Normal affect and mood, without barriers to understanding.  Skin: Exam performed included scalp, hair, face, eyelids, lips, neck, chest, abdomen, back, bilateral arms, hands, fingernails, bilateral legs, feet, toenails, groin, and buttocks. Patient declines genital exam.  Exam was negative except for:    Temples, zygomatic arches, malar and lateral cheeks, upper back - numerous round to oval tan to light brown macules in sun exposed skin c/w solar lentigines   Preauricular cheeks-  inflammatory papules with erosion  Chin  -  erosion  Upper extremities, lower extremities - numerous discrete light and medium brown macules uniformly-pigmented symmetrical in shape with smooth regular borders c/w benign melanocytic nevi   Radial of the left index finger near the DIP joint - 2-3 mm verrucous papule  Right inframammary crease - skin tag  Left shin - 2 mm medium brown reticular pigment pattern with focal globules  Left neck x 2, right neck x 1-  pink discrete but somewhat ill-defined papules     IMPRESSION/PLAN:  The following was discussed with the patient.  Recommendations are as follows:    1. Multiple melanocytic nevi ( left shin )  She endorses a changing mole on the left shin. Upon examination, this appears benign today. She will continue to  monitor.  - Advised to monitor for ABCDEs of melanoma.   - Recommend self-skin examinations with intention every 1-2 months. Suggested the patient to ask someone to help them examine areas of the body that are difficult to monitor. Encouraged the patient to take personal documentation of their nevi to detect any major changes.  - Discussed sun protective practices: avoiding sun exposure, avoiding peak hours of sun exposure (10AM-4PM), wearing protective hats, long protective clothing, and UPF clothing, and apply broad-spectrum sunscreen WVP71+ with reapplication every 2 hours.  - Return for care if they notice these findings or any changing mole.      2. Pruritic rash  She endorses a multiple month history of pink discrete papules on her neck, jawline, and arms. Etiology of this is unclear, but given her history of asthma and atopic dermatitis, this may be consistent with a dermal hypersensitivity (intrinsic dermatitis, bug bites, medications, id reaction). However, her distribution also concerns me for bug bites.  - Recommend she use hydrocortisone or Sarna creat to help the itch.  - Recommend an exterminator to come evaluate her home for any bugs.  - Can consider biopsy if worsening.     3. Verruca vulgaris ( left index )  She has a persistent wart on the left index despite candida injections. As this is not bothersome, she will continue to monitor.     4. Screening for malignant neoplasm of skin  - No concerning lesions on exam. Recommended regular skin exams and sun protection. Observe closely for skin damage/changes, and call if such occurs.     5. Family history of malignant melanoma  We discussed that given the patent's family history of melanoma, she is at an increased risk for melanoma.   - None appear concerning for biopsy or treatment at this time. Advised to monitor for ABCDEs of melanoma.   - Recommend self-skin examinations with intention every 1-2 months, and clinical exams in a yearly basis.  -  Discussed sun protective practices: avoiding sun exposure, avoiding peak hours of sun exposure (10AM-4PM), wearing protective hats, long protective clothing, and UPF clothing, and apply broad-spectrum sunscreen GGY69+ with reapplication every 2 hours.      Return to clinic in 1 year or sooner PRN acute concerns.     11/25/2017 @ 12:51 PM - I, Kathryne Eriksson acted as a Education administrator and documented the service/procedure performed to the best of my knowledge in the presence of Claudia Lovings, MD who will provide the final review and authentication.  Signed: Kathryne Eriksson

## 2018-03-14 ENCOUNTER — Ambulatory Visit: Payer: Self-pay

## 2018-03-14 NOTE — Telephone Encounter (Addendum)
Regarding: FAX- RAVENNA: COVID-19-  Upper respiratory symptoms.   ----- Message from Seabron Spates sent at 03/14/2018  8:23 AM PST -----  FAX- RAVENNA: COVID-19-  Upper respiratory symptoms.     CCL Triage: Received call from [facility] where patient visited 03/08/2018 has patients or staff who have tested positive for Covid.  Reports "dry cough since 03/10/18, more difficult to get air in. Temp fine". Transferred to [facility] [place]. Plan to alert [facility] Infection Prevention team as fyi  To call CCL prn further concerns.

## 2018-03-14 NOTE — Telephone Encounter (Signed)
Reason for Disposition  . Cough  (all triage questions negative)    Additional Information  . Negative: Chest pain  (Exception: MILD central chest pain, present only when coughing)  . Negative: Difficulty breathing  . Negative: Patient sounds very sick or weak to the triager  . Negative: [1] Coughed up blood AND [2] > 1 tablespoon (15 ml) (Exception: blood-tinged sputum)  . Negative: Fever > 103 F (39.4 C)  . Negative: [1] Fever > 101 F (38.3 C) AND [2] age > 67  . Negative: [1] Fever > 101 F (38.3 C) AND [2] bedridden (e.g., nursing home patient, CVA, chronic illness, recovering from surgery)  . Negative: [1] Fever > 100.5 F (38.1 C) AND [2] diabetes mellitus or weak immune system (e.g., HIV positive, cancer chemo, splenectomy, organ transplant, chronic steroids)  . Negative: Wheezing is present  . Negative: [1] Ankle swelling AND [2] swelling is increasing  . Negative: SEVERE coughing spells (e.g., whooping sound after coughing, vomiting after coughing)  . Negative: [1] Continuous (nonstop) coughing interferes with work or school AND [2] no improvement using cough treatment per protocol  . Negative: Fever present > 3 days (72 hours)  . Negative: [1] Fever returns after gone for over 24 hours AND [2] symptoms worse or not improved  . Negative: [1] Sinus pain (around cheekbone or eye) AND [2] present > 24 hours using nasal washes and pain meds  . Negative: Earache is present  . Negative: [1] Blood-tinged sputum has been coughed up AND [2] more than once  . Negative: Cough present > 10 days  . Negative: [1] Nasal discharge AND [2] present > 10 days  . Negative: [1] Patient also has allergy symptoms (e.g., itchy eyes, clear nasal discharge, postnasal drip) AND [2] they are acting up  . Negative: Taking an ACE Inhibitor medication  (e.g., benazepril/LOTENSIN, captopril/CAPOTEN, enalapril/VASOTEC, lisinopril/ZESTRIL)  . Negative: Exposure to TB (Tuberculosis)    Protocols used: COUGH - ACUTE  NON-PRODUCTIVE-ADULT-AH

## 2018-04-21 ENCOUNTER — Encounter (HOSPITAL_BASED_OUTPATIENT_CLINIC_OR_DEPARTMENT_OTHER): Payer: Self-pay | Admitting: Registered Nurse

## 2018-10-31 ENCOUNTER — Other Ambulatory Visit: Payer: Self-pay
